# Patient Record
Sex: Male | Born: 1970 | Race: Black or African American | Hispanic: No | Marital: Married | Smoking: Former smoker
Health system: Southern US, Community
[De-identification: ages and names within clinical notes are randomized; demographics above are authoritative.]

## PROBLEM LIST (undated history)

## (undated) DIAGNOSIS — S8990XA Unspecified injury of unspecified lower leg, initial encounter: Secondary | ICD-10-CM

## (undated) DIAGNOSIS — H3320 Serous retinal detachment, unspecified eye: Secondary | ICD-10-CM

## (undated) DIAGNOSIS — J189 Pneumonia, unspecified organism: Secondary | ICD-10-CM

## (undated) DIAGNOSIS — H5440 Blindness, one eye, unspecified eye: Secondary | ICD-10-CM

## (undated) HISTORY — PX: EYE SURGERY: SHX253

---

## 2004-01-25 ENCOUNTER — Emergency Department (HOSPITAL_COMMUNITY): Admission: EM | Admit: 2004-01-25 | Discharge: 2004-01-25 | Payer: Self-pay | Admitting: Emergency Medicine

## 2005-01-08 ENCOUNTER — Emergency Department (HOSPITAL_COMMUNITY): Admission: EM | Admit: 2005-01-08 | Discharge: 2005-01-08 | Payer: Self-pay | Admitting: Emergency Medicine

## 2007-08-25 DIAGNOSIS — Z8669 Personal history of other diseases of the nervous system and sense organs: Secondary | ICD-10-CM

## 2007-08-30 ENCOUNTER — Emergency Department (HOSPITAL_COMMUNITY): Admission: EM | Admit: 2007-08-30 | Discharge: 2007-08-30 | Payer: Self-pay | Admitting: Emergency Medicine

## 2008-02-13 ENCOUNTER — Ambulatory Visit: Payer: Self-pay | Admitting: Nurse Practitioner

## 2008-02-13 DIAGNOSIS — F17200 Nicotine dependence, unspecified, uncomplicated: Secondary | ICD-10-CM | POA: Insufficient documentation

## 2008-02-13 DIAGNOSIS — I10 Essential (primary) hypertension: Secondary | ICD-10-CM | POA: Insufficient documentation

## 2008-02-13 DIAGNOSIS — E669 Obesity, unspecified: Secondary | ICD-10-CM | POA: Insufficient documentation

## 2008-02-13 DIAGNOSIS — K219 Gastro-esophageal reflux disease without esophagitis: Secondary | ICD-10-CM | POA: Insufficient documentation

## 2008-02-13 LAB — CONVERTED CEMR LAB
ALT: 25 units/L (ref 0–53)
AST: 34 units/L (ref 0–37)
Albumin: 4.5 g/dL (ref 3.5–5.2)
Alkaline Phosphatase: 52 units/L (ref 39–117)
BUN: 14 mg/dL (ref 6–23)
Basophils Absolute: 0 10*3/uL (ref 0.0–0.1)
Basophils Relative: 0 % (ref 0–1)
CO2: 26 meq/L (ref 19–32)
Calcium: 9.9 mg/dL (ref 8.4–10.5)
Chloride: 104 meq/L (ref 96–112)
Cholesterol: 208 mg/dL — ABNORMAL HIGH (ref 0–200)
Creatinine, Ser: 1.35 mg/dL (ref 0.40–1.50)
Eosinophils Absolute: 0.1 10*3/uL (ref 0.0–0.7)
Eosinophils Relative: 2 % (ref 0–5)
Glucose, Bld: 82 mg/dL (ref 70–99)
HCT: 46.8 % (ref 39.0–52.0)
HDL: 40 mg/dL (ref >39)
Hemoglobin: 14.5 g/dL (ref 13.0–17.0)
LDL Cholesterol: 131 mg/dL — ABNORMAL HIGH (ref 0–99)
Lymphocytes Relative: 36 % (ref 12–46)
Lymphs Abs: 1.5 10*3/uL (ref 0.7–4.0)
MCHC: 31 g/dL (ref 30.0–36.0)
MCV: 80.3 fL (ref 78.0–100.0)
Monocytes Absolute: 0.4 10*3/uL (ref 0.1–1.0)
Monocytes Relative: 9 % (ref 3–12)
Neutro Abs: 2.1 10*3/uL (ref 1.7–7.7)
Neutrophils Relative %: 52 % (ref 43–77)
Platelets: 156 10*3/uL (ref 150–400)
Potassium: 4.8 meq/L (ref 3.5–5.3)
RBC: 5.83 M/uL — ABNORMAL HIGH (ref 4.22–5.81)
RDW: 15.9 % — ABNORMAL HIGH (ref 11.5–15.5)
Sodium: 140 meq/L (ref 135–145)
TSH: 2.101 microintl units/mL (ref 0.350–4.50)
Total Bilirubin: 0.5 mg/dL (ref 0.3–1.2)
Total CHOL/HDL Ratio: 5.2
Total Protein: 7.4 g/dL (ref 6.0–8.3)
Triglycerides: 183 mg/dL — ABNORMAL HIGH (ref <150)
VLDL: 37 mg/dL (ref 0–40)
WBC: 4 10*3/uL (ref 4.0–10.5)

## 2008-02-16 ENCOUNTER — Encounter (INDEPENDENT_AMBULATORY_CARE_PROVIDER_SITE_OTHER): Payer: Self-pay | Admitting: Nurse Practitioner

## 2008-03-15 ENCOUNTER — Ambulatory Visit: Payer: Self-pay | Admitting: Nurse Practitioner

## 2008-03-15 LAB — CONVERTED CEMR LAB
Bilirubin Urine: NEGATIVE
Blood in Urine, dipstick: NEGATIVE
Creatinine, Urine: 245.8 mg/dL
Glucose, Urine, Semiquant: NEGATIVE
Ketones, urine, test strip: NEGATIVE
Microalb Creat Ratio: 3.6 mg/g (ref 0.0–30.0)
Microalb, Ur: 0.89 mg/dL (ref 0.00–1.89)
Nitrite: NEGATIVE
Protein, U semiquant: NEGATIVE
Specific Gravity, Urine: >=1.03
Urobilinogen, UA: 0.2
WBC Urine, dipstick: NEGATIVE
pH: 5.5

## 2008-04-13 ENCOUNTER — Telehealth (INDEPENDENT_AMBULATORY_CARE_PROVIDER_SITE_OTHER): Payer: Self-pay | Admitting: Nurse Practitioner

## 2008-04-19 ENCOUNTER — Ambulatory Visit: Payer: Self-pay | Admitting: Nurse Practitioner

## 2008-04-19 DIAGNOSIS — R05 Cough: Secondary | ICD-10-CM | POA: Insufficient documentation

## 2008-05-03 ENCOUNTER — Ambulatory Visit: Payer: Self-pay | Admitting: Nurse Practitioner

## 2008-05-06 DIAGNOSIS — E781 Pure hyperglyceridemia: Secondary | ICD-10-CM | POA: Insufficient documentation

## 2008-05-06 LAB — CONVERTED CEMR LAB
ALT: 15 units/L (ref 0–53)
AST: 16 units/L (ref 0–37)
Albumin: 4.4 g/dL (ref 3.5–5.2)
Alkaline Phosphatase: 49 units/L (ref 39–117)
Bilirubin, Direct: 0.1 mg/dL (ref 0.0–0.3)
Cholesterol: 173 mg/dL (ref 0–200)
HDL: 34 mg/dL — ABNORMAL LOW (ref >39)
Indirect Bilirubin: 0.2 mg/dL (ref 0.0–0.9)
LDL Cholesterol: 88 mg/dL (ref 0–99)
Total Bilirubin: 0.3 mg/dL (ref 0.3–1.2)
Total CHOL/HDL Ratio: 5.1
Total Protein: 7.2 g/dL (ref 6.0–8.3)
Triglycerides: 257 mg/dL — ABNORMAL HIGH (ref <150)
VLDL: 51 mg/dL — ABNORMAL HIGH (ref 0–40)

## 2008-05-14 ENCOUNTER — Telehealth (INDEPENDENT_AMBULATORY_CARE_PROVIDER_SITE_OTHER): Payer: Self-pay | Admitting: *Deleted

## 2008-05-17 ENCOUNTER — Ambulatory Visit: Payer: Self-pay | Admitting: Nurse Practitioner

## 2008-05-21 ENCOUNTER — Encounter (INDEPENDENT_AMBULATORY_CARE_PROVIDER_SITE_OTHER): Payer: Self-pay | Admitting: Nurse Practitioner

## 2008-09-13 ENCOUNTER — Ambulatory Visit: Payer: Self-pay | Admitting: Nurse Practitioner

## 2008-09-13 LAB — CONVERTED CEMR LAB
Cholesterol, target level: 200 mg/dL
HDL goal, serum: 40 mg/dL
LDL Goal: 130 mg/dL

## 2008-09-14 ENCOUNTER — Encounter (INDEPENDENT_AMBULATORY_CARE_PROVIDER_SITE_OTHER): Payer: Self-pay | Admitting: Nurse Practitioner

## 2008-09-14 LAB — CONVERTED CEMR LAB
ALT: 16 units/L (ref 0–53)
AST: 24 units/L (ref 0–37)
Albumin: 4.3 g/dL (ref 3.5–5.2)
Alkaline Phosphatase: 45 units/L (ref 39–117)
BUN: 23 mg/dL (ref 6–23)
CO2: 23 meq/L (ref 19–32)
Calcium: 9.8 mg/dL (ref 8.4–10.5)
Chloride: 100 meq/L (ref 96–112)
Cholesterol: 200 mg/dL (ref 0–200)
Creatinine, Ser: 1.55 mg/dL — ABNORMAL HIGH (ref 0.40–1.50)
Glucose, Bld: 97 mg/dL (ref 70–99)
HDL: 46 mg/dL (ref >39)
LDL Cholesterol: 130 mg/dL — ABNORMAL HIGH (ref 0–99)
Potassium: 4.3 meq/L (ref 3.5–5.3)
Sodium: 137 meq/L (ref 135–145)
Total Bilirubin: 0.4 mg/dL (ref 0.3–1.2)
Total CHOL/HDL Ratio: 4.3
Total Protein: 7.2 g/dL (ref 6.0–8.3)
Triglycerides: 122 mg/dL (ref <150)
VLDL: 24 mg/dL (ref 0–40)

## 2008-11-26 ENCOUNTER — Ambulatory Visit: Payer: Self-pay | Admitting: Nurse Practitioner

## 2009-06-03 ENCOUNTER — Encounter (INDEPENDENT_AMBULATORY_CARE_PROVIDER_SITE_OTHER): Payer: Self-pay | Admitting: Nurse Practitioner

## 2009-09-30 ENCOUNTER — Encounter (INDEPENDENT_AMBULATORY_CARE_PROVIDER_SITE_OTHER): Payer: Self-pay | Admitting: Nurse Practitioner

## 2009-09-30 ENCOUNTER — Telehealth (INDEPENDENT_AMBULATORY_CARE_PROVIDER_SITE_OTHER): Payer: Self-pay | Admitting: Nurse Practitioner

## 2010-03-15 ENCOUNTER — Emergency Department (HOSPITAL_COMMUNITY)
Admission: EM | Admit: 2010-03-15 | Discharge: 2010-03-15 | Payer: Self-pay | Source: Home / Self Care | Admitting: Emergency Medicine

## 2010-04-25 NOTE — Letter (Signed)
Summary: MAILED REQUESTED RECORDS TO [facility]  MAILED REQUESTED RECORDS TO [facility]   Imported By: Arta Bruce 06/03/2009 15:21:53  _____________________________________________________________________  External Attachment:    Type:   Image     Comment:   External Document

## 2010-04-25 NOTE — Progress Notes (Signed)
Summary: Needs to see provider before more refills  Phone Note Outgoing Call   Call placed by: Dutch Quint RN,  September 30, 2009 2:44 PM Summary of Call: Pt. needs to see provider before any more meds can be refilled.  Unable to leave message for pt. -- no answering device.  Letter sent.  Dutch Quint RN  September 30, 2009 2:45 PM

## 2010-04-25 NOTE — Letter (Signed)
Summary: Generic Letter  [facility]  [address] [place], [place] [address]   Phone: [phone]  Fax: [phone]       09/30/2009  JAZE RODINO [address] [place], [place]  [address]  Dear Mr. CICIO,  We have been unable to contact you by telephone.  Please call our office, at your earliest convenience, so that we may speak with you.   Sincerely,   Dutch Quint RN

## 2010-10-21 ENCOUNTER — Emergency Department (HOSPITAL_COMMUNITY)
Admission: EM | Admit: 2010-10-21 | Discharge: 2010-10-21 | Disposition: A | Discharge status: Home / Self Care | Payer: Medicare Other | Attending: Emergency Medicine | Admitting: Emergency Medicine

## 2010-10-21 ENCOUNTER — Emergency Department (HOSPITAL_COMMUNITY): Payer: Medicare Other

## 2010-10-21 DIAGNOSIS — R509 Fever, unspecified: Secondary | ICD-10-CM | POA: Insufficient documentation

## 2010-10-21 DIAGNOSIS — I10 Essential (primary) hypertension: Secondary | ICD-10-CM | POA: Insufficient documentation

## 2010-10-21 DIAGNOSIS — E860 Dehydration: Secondary | ICD-10-CM | POA: Insufficient documentation

## 2010-10-21 DIAGNOSIS — R42 Dizziness and giddiness: Secondary | ICD-10-CM | POA: Insufficient documentation

## 2010-10-21 DIAGNOSIS — H5440 Blindness, one eye, unspecified eye: Secondary | ICD-10-CM | POA: Insufficient documentation

## 2010-10-21 DIAGNOSIS — I4949 Other premature depolarization: Secondary | ICD-10-CM | POA: Insufficient documentation

## 2010-10-21 DIAGNOSIS — R0602 Shortness of breath: Secondary | ICD-10-CM | POA: Insufficient documentation

## 2010-10-21 LAB — DIFFERENTIAL
Basophils Absolute: 0 10*3/uL (ref 0.0–0.1)
Basophils Relative: 0 % (ref 0–1)
Eosinophils Absolute: 0 10*3/uL (ref 0.0–0.7)
Eosinophils Relative: 1 % (ref 0–5)
Lymphocytes Relative: 24 % (ref 12–46)
Lymphs Abs: 1 10*3/uL (ref 0.7–4.0)
Monocytes Absolute: 0.3 10*3/uL (ref 0.1–1.0)
Monocytes Relative: 8 % (ref 3–12)
Neutro Abs: 3 10*3/uL (ref 1.7–7.7)
Neutrophils Relative %: 67 % (ref 43–77)

## 2010-10-21 LAB — BASIC METABOLIC PANEL
BUN: 20 mg/dL (ref 6–23)
CO2: 25 mEq/L (ref 19–32)
Calcium: 9.8 mg/dL (ref 8.4–10.5)
Chloride: 101 mEq/L (ref 96–112)
Creatinine, Ser: 1.51 mg/dL — ABNORMAL HIGH (ref 0.50–1.35)
GFR calc Af Amer: >60 mL/min (ref >60)
GFR calc non Af Amer: 51 mL/min — ABNORMAL LOW (ref >60)
Glucose, Bld: 111 mg/dL — ABNORMAL HIGH (ref 70–99)
Potassium: 3.9 mEq/L (ref 3.5–5.1)
Sodium: 137 mEq/L (ref 135–145)

## 2010-10-21 LAB — TROPONIN I: Troponin I: <0.3 ng/mL (ref <0.30)

## 2010-10-21 LAB — CBC
HCT: 43.5 % (ref 39.0–52.0)
Hemoglobin: 14.7 g/dL (ref 13.0–17.0)
MCH: 25.3 pg — ABNORMAL LOW (ref 26.0–34.0)
MCHC: 33.8 g/dL (ref 30.0–36.0)
MCV: 74.9 fL — ABNORMAL LOW (ref 78.0–100.0)
Platelets: 145 10*3/uL — ABNORMAL LOW (ref 150–400)
RBC: 5.81 MIL/uL (ref 4.22–5.81)
RDW: 14.6 % (ref 11.5–15.5)
WBC: 4.3 10*3/uL (ref 4.0–10.5)

## 2010-10-21 LAB — CK TOTAL AND CKMB (NOT AT ARMC)
CK, MB: 4.7 ng/mL — ABNORMAL HIGH (ref 0.3–4.0)
Relative Index: 1.2 (ref 0.0–2.5)
Total CK: 382 U/L — ABNORMAL HIGH (ref 7–232)

## 2010-10-23 LAB — CK TOTAL AND CKMB (NOT AT ARMC)
CK, MB: 4.1 ng/mL — ABNORMAL HIGH (ref 0.3–4.0)
Relative Index: 1.3 (ref 0.0–2.5)
Total CK: 328 U/L — ABNORMAL HIGH (ref 7–232)

## 2010-10-23 LAB — TROPONIN I: Troponin I: <0.3 ng/mL (ref <0.30)

## 2011-05-01 ENCOUNTER — Ambulatory Visit (INDEPENDENT_AMBULATORY_CARE_PROVIDER_SITE_OTHER): Payer: Medicare Other | Admitting: Family Medicine

## 2011-05-01 VITALS — BP 142/100 | HR 66 | Temp 98.7°F | Resp 18 | Ht 74.5 in | Wt 268.0 lb

## 2011-05-01 DIAGNOSIS — K219 Gastro-esophageal reflux disease without esophagitis: Secondary | ICD-10-CM | POA: Diagnosis not present

## 2011-05-01 DIAGNOSIS — I10 Essential (primary) hypertension: Secondary | ICD-10-CM

## 2011-05-01 DIAGNOSIS — Z0000 Encounter for general adult medical examination without abnormal findings: Secondary | ICD-10-CM | POA: Diagnosis not present

## 2011-05-01 LAB — CBC WITH DIFFERENTIAL/PLATELET
Basophils Absolute: 0 10*3/uL (ref 0.0–0.1)
Basophils Relative: 1 % (ref 0–1)
Eosinophils Absolute: 0.1 10*3/uL (ref 0.0–0.7)
Eosinophils Relative: 3 % (ref 0–5)
HCT: 43.7 % (ref 39.0–52.0)
Hemoglobin: 14.1 g/dL (ref 13.0–17.0)
Lymphocytes Relative: 37 % (ref 12–46)
Lymphs Abs: 1.3 10*3/uL (ref 0.7–4.0)
MCH: 24.7 pg — ABNORMAL LOW (ref 26.0–34.0)
MCHC: 32.3 g/dL (ref 30.0–36.0)
MCV: 76.4 fL — ABNORMAL LOW (ref 78.0–100.0)
Monocytes Absolute: 0.3 10*3/uL (ref 0.1–1.0)
Monocytes Relative: 9 % (ref 3–12)
Neutro Abs: 1.7 10*3/uL (ref 1.7–7.7)
Neutrophils Relative %: 50 % (ref 43–77)
Platelets: 126 10*3/uL — ABNORMAL LOW (ref 150–400)
RBC: 5.72 MIL/uL (ref 4.22–5.81)
RDW: 14.3 % (ref 11.5–15.5)
WBC: 3.5 10*3/uL — ABNORMAL LOW (ref 4.0–10.5)

## 2011-05-01 LAB — COMPREHENSIVE METABOLIC PANEL
ALT: 10 U/L (ref 0–53)
AST: 29 U/L (ref 0–37)
Albumin: 4.7 g/dL (ref 3.5–5.2)
Alkaline Phosphatase: 60 U/L (ref 39–117)
BUN: 17 mg/dL (ref 6–23)
CO2: 27 mEq/L (ref 19–32)
Calcium: 9.5 mg/dL (ref 8.4–10.5)
Chloride: 104 mEq/L (ref 96–112)
Creat: 1.49 mg/dL — ABNORMAL HIGH (ref 0.50–1.35)
Glucose, Bld: 96 mg/dL (ref 70–99)
Potassium: 4.7 mEq/L (ref 3.5–5.3)
Sodium: 140 mEq/L (ref 135–145)
Total Bilirubin: 0.4 mg/dL (ref 0.3–1.2)
Total Protein: 7.4 g/dL (ref 6.0–8.3)

## 2011-05-01 LAB — LIPID PANEL
Cholesterol: 198 mg/dL (ref 0–200)
HDL: 39 mg/dL — ABNORMAL LOW (ref >39)
LDL Cholesterol: 133 mg/dL — ABNORMAL HIGH (ref 0–99)
Total CHOL/HDL Ratio: 5.1 Ratio
Triglycerides: 130 mg/dL (ref <150)
VLDL: 26 mg/dL (ref 0–40)

## 2011-05-01 LAB — TSH: TSH: 1.912 u[IU]/mL (ref 0.350–4.500)

## 2011-05-01 MED ORDER — ASPIRIN EC 81 MG PO TBEC: 81.0000 mg | DELAYED_RELEASE_TABLET | Freq: Every day | ORAL | Status: DC

## 2011-05-01 MED ORDER — LISINOPRIL-HYDROCHLOROTHIAZIDE 20-25 MG PO TABS: 1.0000 | ORAL_TABLET | Freq: Every day | ORAL | Status: DC

## 2011-05-01 MED ORDER — OMEPRAZOLE 40 MG PO CPDR: 40.0000 mg | DELAYED_RELEASE_CAPSULE | Freq: Every day | ORAL | Status: DC

## 2011-05-01 NOTE — Progress Notes (Signed)
  Subjective:    Patient ID: Luberta Mutter, male    DOB: 10/13/1970, 41 y.o.   MRN: [id number]  HPI 41 year old AA male presents for CPE  Hypertension- not taking medications daily.  Rx expired and using wife's RX on prn basis.                   Review of Systems  Constitutional: Negative.   Respiratory: Negative for shortness of breath.   Cardiovascular: Palpitations: occasional.  Neurological: Negative for dizziness.       Objective:   Physical Exam  Constitutional: He is oriented to person, place, and time. He appears well-developed and well-nourished.  HENT:  Head: Normocephalic and atraumatic.  Right Ear: External ear normal.  Left Ear: External ear normal.  Eyes: Conjunctivae and EOM are normal. Pupils are equal, round, and reactive to light.  Neck: Normal range of motion. Neck supple.  Cardiovascular: Normal rate, regular rhythm and normal heart sounds.   Pulmonary/Chest: Effort normal and breath sounds normal.  Abdominal: Soft. Bowel sounds are normal.  Genitourinary: Rectum normal, prostate normal and penis normal.  Musculoskeletal: Normal range of motion.  Neurological: He is alert and oriented to person, place, and time.  Skin: Skin is warm and dry.          Assessment & Plan:   1. Routine general medical examination at a health care facility    2. HTN (hypertension)  EKG 12-Lead, CBC with Differential, Comprehensive metabolic panel, Lipid panel, TSH  3. GERD (gastroesophageal reflux disease)     P/ Anticipatory guidance

## 2011-05-28 ENCOUNTER — Ambulatory Visit (INDEPENDENT_AMBULATORY_CARE_PROVIDER_SITE_OTHER): Payer: Medicare Other | Admitting: Family Medicine

## 2011-05-28 ENCOUNTER — Ambulatory Visit: Payer: Medicare Other

## 2011-05-28 VITALS — BP 125/81 | HR 92 | Temp 99.0°F | Resp 20 | Ht 73.5 in | Wt 260.0 lb

## 2011-05-28 DIAGNOSIS — R112 Nausea with vomiting, unspecified: Secondary | ICD-10-CM

## 2011-05-28 DIAGNOSIS — R1110 Vomiting, unspecified: Secondary | ICD-10-CM

## 2011-05-28 DIAGNOSIS — M79643 Pain in unspecified hand: Secondary | ICD-10-CM

## 2011-05-28 DIAGNOSIS — M25549 Pain in joints of unspecified hand: Secondary | ICD-10-CM | POA: Diagnosis not present

## 2011-05-28 DIAGNOSIS — R197 Diarrhea, unspecified: Secondary | ICD-10-CM

## 2011-05-28 LAB — POCT CBC
Granulocyte percent: 70.1 %G (ref 37–80)
HCT, POC: 45.6 % (ref 43.5–53.7)
Hemoglobin: 15 g/dL (ref 14.1–18.1)
Lymph, poc: 0.7 (ref 0.6–3.4)
MCH, POC: 25.1 pg — AB (ref 27–31.2)
MCHC: 32.9 g/dL (ref 31.8–35.4)
MCV: 76.4 fL — AB (ref 80–97)
MID (cbc): 0.4 (ref 0–0.9)
MPV: 11 fL (ref 0–99.8)
POC Granulocyte: 2.7 (ref 2–6.9)
POC LYMPH PERCENT: 19.2 %L (ref 10–50)
POC MID %: 10.7 %M (ref 0–12)
Platelet Count, POC: 161 10*3/uL (ref 142–424)
RBC: 5.97 M/uL (ref 4.69–6.13)
RDW, POC: 14.6 %
WBC: 3.9 10*3/uL — AB (ref 4.6–10.2)

## 2011-05-28 MED ORDER — ONDANSETRON HCL 8 MG PO TABS: 8.0000 mg | ORAL_TABLET | Freq: Three times a day (TID) | ORAL | Status: AC | PRN

## 2011-05-28 MED ORDER — ONDANSETRON 4 MG PO TBDP
8.0000 mg | ORAL_TABLET | Freq: Once | ORAL | Status: AC
  Administered 2011-05-28: 8 mg via ORAL

## 2011-05-28 NOTE — Progress Notes (Signed)
Patient Name: Richard Bautista Date of Birth: 02-Aug-1970 Medical Record Number: [id number] Gender: male Date of Encounter: 05/28/2011  History of Present Illness:  Richard Bautista is a 41 y.o. very pleasant male patient who presents with the following:  Here today with illness- vomiting, aches, diarrhea, fever, chills,feels lightheaded and weak. Also notes a rash on his face. Vomiting started yesterday evening, diarrhea started a little bit earlier.  No blood in diarrhea.  Did not check temperature and has been taking ibuprofent but none yet today.  Has thrown up about 3x, diarrhea 4 or 5 times.  Still feels nauseated.  Has been able to eat some, but has vomitied some of his intake.  Drank just a little juice this am.  Wife noted rash on his face and wonders what it is. No other rash  Also notes that he hurt his left 4th finger in martial arts last week- wonders if he should have an xray.  No swelling recently exposed to a friend with GI illness Patient Active Problem List  Diagnoses  . HYPERTRIGLYCERIDEMIA  . OBESITY  . TOBACCO ABUSE  . HYPERTENSION, BENIGN ESSENTIAL  . GERD  . COUGH  . RETINAL DETACHMENT, RIGHT EYE, HX OF   No past medical history on file. No past surgical history on file. History  Substance Use Topics  . Smoking status: Former Smoker    Types: Cigars    Quit date: 02/28/2011  . Smokeless tobacco: Not on file  . Alcohol Use: 0.0 oz/week   No family history on file. Allergies  Allergen Reactions  . Erythromycin     REACTION: faint, chills    Medication list has been reviewed and updated.  Review of Systems: As per HPI- otherwise negative.   Physical Examination: Filed Vitals:   05/28/11 0847  BP: 125/81  Pulse: 92  Temp: 99 F (37.2 C)  TempSrc: Oral  Resp: 20  Height: 6' 1.5" (1.867 m)  Weight: 260 lb (117.935 kg)    Body mass index is 33.84 kg/(m^2).  GEN: WDWN, NAD, Non-toxic, A & O x 3, obese HEENT: Atraumatic, Normocephalic. Neck  supple. No masses, No LAD. TM and oropharynx wnl- pupils are post- surgical bilaterally Ears and Nose: No external deformity. CV: RRR, No M/G/R. No JVD. No thrill. No extra heart sounds. PULM: CTA B, no wheezes, crackles, rhonchi. No retractions. No resp. distress. No accessory muscle use. ABD: S, NT, ND, +BS. No rebound. No HSM. Obese but no tenderness EXTR: No c/c/e.  Left ring finger has a small abrasion, but no swelling, tenderness or redness, full ROM NEURO Normal gait.  PSYCH: Normally interactive. Conversant. Not depressed or anxious appearing.  Calm demeanor.  Here is a slight "rash" on his face- around eyes.  This appears to be broken blood vessels due to vomiting, no other rash visible  [facility] reading (PRIMARY) by  Dr. Patsy Lager.  negative hand   Results for orders placed in visit on 05/28/11  POCT CBC      Component Value Range   WBC 3.9 (*) 4.6 - 10.2 (K/uL)   Lymph, poc 0.7  0.6 - 3.4    POC LYMPH PERCENT 19.2  10 - 50 (%L)   MID (cbc) 0.4  0 - 0.9    POC MID % 10.7  0 - 12 (%M)   POC Granulocyte 2.7  2 - 6.9    Granulocyte percent 70.1  37 - 80 (%G)   RBC 5.97  4.69 - 6.13 (M/uL)  Hemoglobin 15.0  14.1 - 18.1 (g/dL)   HCT, POC 13.0  86.5 - 53.7 (%)   MCV 76.4 (*) 80 - 97 (fL)   MCH, POC 25.1 (*) 27 - 31.2 (pg)   MCHC 32.9  31.8 - 35.4 (g/dL)   RDW, POC 78.4     Platelet Count, POC 161  142 - 424 (K/uL)   MPV 11.0  0 - 99.8 (fL)    Assessment and Plan: 1. Vomiting  ondansetron (ZOFRAN-ODT) disintegrating tablet 8 mg, ondansetron (ZOFRAN) 8 MG tablet  2. Diarrhea    3. Hand pain  DG Hand Complete Left   Felt better after PO zofran at clinic.  Gave zofran rx to take home and discussed pushing fluids, brat diet with patient and his wife.  He will let us know if he does not continue to improve, sooner if other symptoms develop. He will avoid martial arts until his hand feels back to normal.

## 2011-06-04 ENCOUNTER — Telehealth: Payer: Self-pay | Admitting: Family Medicine

## 2011-06-04 NOTE — Telephone Encounter (Signed)
Per Dr. Patsy Lager, call patient to check status.  Apparently he was unable to receive Zofran rx (fax in call bin).  Does he still need this?    LMOM to CB.

## 2011-06-07 ENCOUNTER — Ambulatory Visit (INDEPENDENT_AMBULATORY_CARE_PROVIDER_SITE_OTHER): Payer: Medicare Other | Admitting: Emergency Medicine

## 2011-06-07 VITALS — BP 132/80 | HR 73 | Temp 98.2°F | Resp 16 | Ht 73.5 in | Wt 265.0 lb

## 2011-06-07 DIAGNOSIS — R070 Pain in throat: Secondary | ICD-10-CM

## 2011-06-07 DIAGNOSIS — R509 Fever, unspecified: Secondary | ICD-10-CM

## 2011-06-07 DIAGNOSIS — R5382 Chronic fatigue, unspecified: Secondary | ICD-10-CM | POA: Diagnosis not present

## 2011-06-07 DIAGNOSIS — J029 Acute pharyngitis, unspecified: Secondary | ICD-10-CM

## 2011-06-07 LAB — POCT INFLUENZA A/B: Influenza B, POC: NEGATIVE

## 2011-06-07 LAB — POCT RAPID STREP A (OFFICE): Rapid Strep A Screen: NEGATIVE

## 2011-06-07 MED ORDER — MAGIC MOUTHWASH W/LIDOCAINE: ORAL | Status: DC

## 2011-06-07 MED ORDER — OMEPRAZOLE 40 MG PO CPDR: 40.0000 mg | DELAYED_RELEASE_CAPSULE | Freq: Every day | ORAL | Status: DC

## 2011-06-07 NOTE — Progress Notes (Signed)
  Subjective:    Patient ID: Richard Bautista, male    DOB: [birth year], 41 y.o.   MRN: [id number]  HPI patient here with onset 2 days ago of fever headache sore throat. He also feels dizzy. He feels that he gets lightheaded when he is up. He has had chest pain but only when he coughs. He at times has had production of a brown colored phlegm.    Review of Systems  Constitutional: Positive for fever, appetite change and fatigue.  HENT: Positive for sore throat. Negative for mouth sores and trouble swallowing.   Eyes:       Patient is blind in one eye.  Respiratory: Positive for cough.        His cough is productive at times of brownish type phlegm  Cardiovascular: Negative.   Gastrointestinal: Negative.   Genitourinary: Negative.   Musculoskeletal: Positive for myalgias.       Objective:   Physical Exam  Constitutional: He appears well-developed.  HENT:  Head: Normocephalic.  Right Ear: External ear normal.  Left Ear: External ear normal.  Neck: Neck supple. No JVD present. No tracheal deviation present. No thyromegaly present.       Throat is red.  Cardiovascular: Normal rate, regular rhythm and normal heart sounds.   Pulmonary/Chest: No stridor. No respiratory distress. He has no wheezes. He has no rales. He exhibits no tenderness.  Abdominal: There is no tenderness. There is no rebound.  Lymphadenopathy:    He has no cervical adenopathy.          Assessment & Plan:   Patient here with flulike symptoms. We'll check strep test and flu test.

## 2011-06-08 NOTE — Telephone Encounter (Signed)
Called [facility] and gave them permission to substitute their formula for Magic MW as long as it contains Lidocaine. Also called pt to notify him this was done and that we have just received form for PA of omeprazole and will fill it out and fax it back to insurance. Pt thanked [place]

## 2011-06-08 NOTE — Telephone Encounter (Signed)
Pt states that the pharmacy told him that we needed to contact his insurance company regarding the omeprazole, and also they state that we never sent over instructions for the mouthwash that he was prescribed. Pharmacy: [facility] [phone]

## 2011-06-15 ENCOUNTER — Other Ambulatory Visit: Payer: Self-pay | Admitting: *Deleted

## 2011-06-15 MED ORDER — OMEPRAZOLE 20 MG PO CPDR: 40.0000 mg | DELAYED_RELEASE_CAPSULE | Freq: Every day | ORAL | Status: DC

## 2011-07-31 DIAGNOSIS — H35379 Puckering of macula, unspecified eye: Secondary | ICD-10-CM | POA: Diagnosis not present

## 2011-08-27 DIAGNOSIS — S6000XA Contusion of unspecified finger without damage to nail, initial encounter: Secondary | ICD-10-CM | POA: Diagnosis not present

## 2011-09-11 ENCOUNTER — Emergency Department (HOSPITAL_COMMUNITY): Payer: Medicare Other

## 2011-09-11 ENCOUNTER — Encounter (HOSPITAL_COMMUNITY): Payer: Self-pay

## 2011-09-11 ENCOUNTER — Emergency Department (HOSPITAL_COMMUNITY)
Admission: EM | Admit: 2011-09-11 | Discharge: 2011-09-11 | Disposition: A | Discharge status: Home / Self Care | Payer: Medicare Other | Attending: Emergency Medicine | Admitting: Emergency Medicine

## 2011-09-11 DIAGNOSIS — IMO0002 Reserved for concepts with insufficient information to code with codable children: Secondary | ICD-10-CM

## 2011-09-11 DIAGNOSIS — Z87891 Personal history of nicotine dependence: Secondary | ICD-10-CM | POA: Insufficient documentation

## 2011-09-11 DIAGNOSIS — Z7982 Long term (current) use of aspirin: Secondary | ICD-10-CM | POA: Diagnosis not present

## 2011-09-11 DIAGNOSIS — S335XXA Sprain of ligaments of lumbar spine, initial encounter: Secondary | ICD-10-CM | POA: Diagnosis not present

## 2011-09-11 DIAGNOSIS — M545 Low back pain, unspecified: Secondary | ICD-10-CM | POA: Diagnosis not present

## 2011-09-11 DIAGNOSIS — Z79899 Other long term (current) drug therapy: Secondary | ICD-10-CM | POA: Diagnosis not present

## 2011-09-11 DIAGNOSIS — M47817 Spondylosis without myelopathy or radiculopathy, lumbosacral region: Secondary | ICD-10-CM | POA: Diagnosis not present

## 2011-09-11 HISTORY — DX: Blindness, one eye, unspecified eye: H54.40

## 2011-09-11 HISTORY — DX: Pneumonia, unspecified organism: J18.9

## 2011-09-11 HISTORY — DX: Unspecified injury of unspecified lower leg, initial encounter: S89.90XA

## 2011-09-11 HISTORY — DX: Serous retinal detachment, unspecified eye: H33.20

## 2011-09-11 LAB — URINALYSIS, ROUTINE W REFLEX MICROSCOPIC
Bilirubin Urine: NEGATIVE
Nitrite: NEGATIVE
Specific Gravity, Urine: 1.027 (ref 1.005–1.030)
pH: 6 (ref 5.0–8.0)

## 2011-09-11 MED ORDER — DIAZEPAM 5 MG PO TABS
10.0000 mg | ORAL_TABLET | Freq: Once | ORAL | Status: AC
  Administered 2011-09-11: 10 mg via ORAL
  Filled 2011-09-11: qty 1

## 2011-09-11 MED ORDER — METHOCARBAMOL 750 MG PO TABS: 750.0000 mg | ORAL_TABLET | Freq: Four times a day (QID) | ORAL | Status: AC

## 2011-09-11 MED ORDER — IBUPROFEN 600 MG PO TABS: 600.0000 mg | ORAL_TABLET | Freq: Four times a day (QID) | ORAL | Status: AC | PRN

## 2011-09-11 MED ORDER — OXYCODONE-ACETAMINOPHEN 5-325 MG PO TABS
2.0000 | ORAL_TABLET | Freq: Once | ORAL | Status: AC
  Administered 2011-09-11: 2 via ORAL
  Filled 2011-09-11: qty 2

## 2011-09-11 MED ORDER — OXYCODONE-ACETAMINOPHEN 5-325 MG PO TABS: 2.0000 | ORAL_TABLET | ORAL | Status: AC | PRN

## 2011-09-11 MED ORDER — DIAZEPAM 5 MG PO TABS
ORAL_TABLET | ORAL | Status: AC
  Filled 2011-09-11: qty 1

## 2011-09-11 NOTE — ED Notes (Signed)
Patient transported to X-ray 
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
## 2011-09-11 NOTE — Discharge Instructions (Signed)
Back Pain, Adult Low back pain is very common. About 1 in 5 people have back pain.The cause of low back pain is rarely dangerous. The pain often gets better over time.About half of people with a sudden onset of back pain feel better in just 2 weeks. About 8 in 10 people feel better by 6 weeks.  CAUSES Some common causes of back pain include:  Strain of the muscles or ligaments supporting the spine.   Wear and tear (degeneration) of the spinal discs.   Arthritis.   Direct injury to the back.  DIAGNOSIS Most of the time, the direct cause of low back pain is not known.However, back pain can be treated effectively even when the exact cause of the pain is unknown.Answering your caregiver's questions about your overall health and symptoms is one of the most accurate ways to make sure the cause of your pain is not dangerous. If your caregiver needs more information, he or she may order lab work or imaging tests (X-rays or MRIs).However, even if imaging tests show changes in your back, this usually does not require surgery. HOME CARE INSTRUCTIONS For many people, back pain returns.Since low back pain is rarely dangerous, it is often a condition that people can learn to manageon their own.   Remain active. It is stressful on the back to sit or stand in one place. Do not sit, drive, or stand in one place for more than 30 minutes at a time. Take short walks on level surfaces as soon as pain allows.Try to increase the length of time you walk each day.   Do not stay in bed.Resting more than 1 or 2 days can delay your recovery.   Do not avoid exercise or work.Your body is made to move.It is not dangerous to be active, even though your back may hurt.Your back will likely heal faster if you return to being active before your pain is gone.   Pay attention to your body when you bend and lift. Many people have less discomfortwhen lifting if they bend their knees, keep the load close to their  bodies,and avoid twisting. Often, the most comfortable positions are those that put less stress on your recovering back.   Find a comfortable position to sleep. Use a firm mattress and lie on your side with your knees slightly bent. If you lie on your back, put a pillow under your knees.   Only take over-the-counter or prescription medicines as directed by your caregiver. Over-the-counter medicines to reduce pain and inflammation are often the most helpful.Your caregiver may prescribe muscle relaxant drugs.These medicines help dull your pain so you can more quickly return to your normal activities and healthy exercise.   Put ice on the injured area.   Put ice in a plastic bag.   Place a towel between your skin and the bag.   Leave the ice on for 15 to 20 minutes, 3 to 4 times a day for the first 2 to 3 days. After that, ice and heat may be alternated to reduce pain and spasms.   Ask your caregiver about trying back exercises and gentle massage. This may be of some benefit.   Avoid feeling anxious or stressed.Stress increases muscle tension and can worsen back pain.It is important to recognize when you are anxious or stressed and learn ways to manage it.Exercise is a great option.  SEEK MEDICAL CARE IF:  You have pain that is not relieved with rest or medicine.   You have   pain that does not improve in 1 week.   You have new symptoms.   You are generally not feeling well.  SEEK IMMEDIATE MEDICAL CARE IF:   You have pain that radiates from your back into your legs.   You develop new bowel or bladder control problems.   You have unusual weakness or numbness in your arms or legs.   You develop nausea or vomiting.   You develop abdominal pain.   You feel faint.  Document Released: 03/12/2005 Document Revised: 03/01/2011 Document Reviewed: 07/31/2010 [employer] Patient Information 2012 [employer]. 
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
## 2011-10-11 DIAGNOSIS — H35349 Macular cyst, hole, or pseudohole, unspecified eye: Secondary | ICD-10-CM | POA: Diagnosis not present

## 2011-10-11 DIAGNOSIS — Z8669 Personal history of other diseases of the nervous system and sense organs: Secondary | ICD-10-CM | POA: Diagnosis not present

## 2011-10-11 DIAGNOSIS — H43819 Vitreous degeneration, unspecified eye: Secondary | ICD-10-CM | POA: Diagnosis not present

## 2011-10-11 DIAGNOSIS — H2703 Aphakia, bilateral: Secondary | ICD-10-CM | POA: Insufficient documentation

## 2011-10-11 DIAGNOSIS — H35342 Macular cyst, hole, or pseudohole, left eye: Secondary | ICD-10-CM | POA: Insufficient documentation

## 2011-10-11 DIAGNOSIS — H2700 Aphakia, unspecified eye: Secondary | ICD-10-CM | POA: Diagnosis not present

## 2011-10-11 DIAGNOSIS — H31019 Macula scars of posterior pole (postinflammatory) (post-traumatic), unspecified eye: Secondary | ICD-10-CM | POA: Diagnosis not present

## 2011-10-11 DIAGNOSIS — Z9889 Other specified postprocedural states: Secondary | ICD-10-CM | POA: Diagnosis not present

## 2011-11-07 ENCOUNTER — Other Ambulatory Visit: Payer: Self-pay | Admitting: Family Medicine

## 2012-01-17 ENCOUNTER — Other Ambulatory Visit: Payer: Self-pay | Admitting: Physician Assistant

## 2012-05-05 ENCOUNTER — Encounter (HOSPITAL_COMMUNITY): Payer: Self-pay | Admitting: *Deleted

## 2012-05-05 ENCOUNTER — Emergency Department (HOSPITAL_COMMUNITY)
Admission: EM | Admit: 2012-05-05 | Discharge: 2012-05-05 | Disposition: A | Discharge status: Home / Self Care | Payer: Medicare Other | Attending: Emergency Medicine | Admitting: Emergency Medicine

## 2012-05-05 ENCOUNTER — Emergency Department (HOSPITAL_COMMUNITY): Payer: Medicare Other

## 2012-05-05 DIAGNOSIS — Z79899 Other long term (current) drug therapy: Secondary | ICD-10-CM | POA: Diagnosis not present

## 2012-05-05 DIAGNOSIS — H5440 Blindness, one eye, unspecified eye: Secondary | ICD-10-CM | POA: Diagnosis not present

## 2012-05-05 DIAGNOSIS — Z87891 Personal history of nicotine dependence: Secondary | ICD-10-CM | POA: Diagnosis not present

## 2012-05-05 DIAGNOSIS — J40 Bronchitis, not specified as acute or chronic: Secondary | ICD-10-CM | POA: Diagnosis not present

## 2012-05-05 DIAGNOSIS — R1110 Vomiting, unspecified: Secondary | ICD-10-CM | POA: Insufficient documentation

## 2012-05-05 DIAGNOSIS — Z8739 Personal history of other diseases of the musculoskeletal system and connective tissue: Secondary | ICD-10-CM | POA: Insufficient documentation

## 2012-05-05 DIAGNOSIS — J3489 Other specified disorders of nose and nasal sinuses: Secondary | ICD-10-CM | POA: Insufficient documentation

## 2012-05-05 DIAGNOSIS — Z8701 Personal history of pneumonia (recurrent): Secondary | ICD-10-CM | POA: Diagnosis not present

## 2012-05-05 DIAGNOSIS — J209 Acute bronchitis, unspecified: Secondary | ICD-10-CM | POA: Diagnosis not present

## 2012-05-05 DIAGNOSIS — J42 Unspecified chronic bronchitis: Secondary | ICD-10-CM | POA: Diagnosis not present

## 2012-05-05 MED ORDER — HYDROCOD POLST-CHLORPHEN POLST 10-8 MG/5ML PO LQCR: 5.0000 mL | Freq: Two times a day (BID) | ORAL | Status: DC | PRN

## 2012-05-05 MED ORDER — ALBUTEROL SULFATE HFA 108 (90 BASE) MCG/ACT IN AERS
1.0000 | INHALATION_SPRAY | Freq: Four times a day (QID) | RESPIRATORY_TRACT | Status: DC | PRN
  Administered 2012-05-05: 2 via RESPIRATORY_TRACT
  Filled 2012-05-05: qty 6.7

## 2012-05-05 NOTE — ED Notes (Signed)
Pt states he has sore throat, head congestion, stomach hurting, and shortness of breath; started feeling bad about a week ago, worsened overnight;

## 2012-05-05 NOTE — ED Notes (Signed)
Pt stable at time of discharge; denied questions/concerns

## 2012-05-05 NOTE — Discharge Instructions (Signed)
 Bronchitis Bronchitis is the body's way of reacting to injury and/or infection (inflammation) of the bronchi. Bronchi are the air tubes that extend from the windpipe into the lungs. If the inflammation becomes severe, it may cause shortness of breath. CAUSES  Inflammation may be caused by:  A virus.  Germs (bacteria).  Dust.  Allergens.  Pollutants and many other irritants. The cells lining the bronchial tree are covered with tiny hairs (cilia). These constantly beat upward, away from the lungs, toward the mouth. This keeps the lungs free of pollutants. When these cells become too irritated and are unable to do their job, mucus begins to develop. This causes the characteristic cough of bronchitis. The cough clears the lungs when the cilia are unable to do their job. Without either of these protective mechanisms, the mucus would settle in the lungs. Then you would develop pneumonia. Smoking is a common cause of bronchitis and can contribute to pneumonia. Stopping this habit is the single most important thing you can do to help yourself. TREATMENT   Your caregiver may prescribe an antibiotic if the cough is caused by bacteria. Also, medicines that open up your airways make it easier to breathe. Your caregiver may also recommend or prescribe an expectorant. It will loosen the mucus to be coughed up. Only take over-the-counter or prescription medicines for pain, discomfort, or fever as directed by your caregiver.  Removing whatever causes the problem (smoking, for example) is critical to preventing the problem from getting worse.  Cough suppressants may be prescribed for relief of cough symptoms.  Inhaled medicines may be prescribed to help with symptoms now and to help prevent problems from returning.  For those with recurrent (chronic) bronchitis, there may be a need for steroid medicines. SEEK IMMEDIATE MEDICAL CARE IF:   During treatment, you develop more pus-like mucus (purulent  sputum).  You have a fever.  Your baby is older than 3 months with a rectal temperature of 102 F (38.9 C) or higher.  Your baby is 68 months old or younger with a rectal temperature of 100.4 F (38 C) or higher.  You become progressively more ill.  You have increased difficulty breathing, wheezing, or shortness of breath. It is necessary to seek immediate medical care if you are elderly or sick from any other disease. MAKE SURE YOU:   Understand these instructions.  Will watch your condition.  Will get help right away if you are not doing well or get worse. Document Released: 03/12/2005 Document Revised: 06/04/2011 Document Reviewed: 01/20/2008 [facility] Patient Information 2013 [place], [place].    Narcotic and benzodiazepine use may cause drowsiness, slowed breathing or dependence.  Please use with caution and do not drive, operate machinery or watch young children alone while taking them.  Taking combinations of these medications or drinking alcohol will potentiate these effects.

## 2012-05-05 NOTE — ED Provider Notes (Signed)
History     CSN: [id number]  Arrival date & time 05/05/12  0720   First MD Initiated Contact with Patient 05/05/12 [phone]      Chief Complaint  Patient presents with  . Sore Throat  . Nasal Congestion    (Consider location/radiation/quality/duration/timing/severity/associated sxs/prior treatment) HPI Comments: 42 y/o male presents to the ED complaining of  Non-productive cough, congestion, sore throat, chest pain, and subjective fever x1 day.  Patient complains of 3 episodes of vomiting after coughing spells.  He denies nausea.  Patient states that he feels that his symptoms have been stable, but fears he has pneumonia as he has had it in the past.  Patient felt some relief with 600 mg of ibuprofen.  No aggravating factors have been identified.  Patient denies getting a flu shot this year.  Patient smokes 2-3 black and mild cigars per week.    Patient is a 42 y.o. male presenting with pharyngitis. The history is provided by the patient. No language interpreter was used.  Sore Throat Associated symptoms include chest pain and shortness of breath.    Past Medical History  Diagnosis Date  . Blind one eye     right   . Detached retina   . Pneumonia   . Knee injury     Past Surgical History  Procedure Laterality Date  . Eye surgery      History reviewed. No pertinent family history.  History  Substance Use Topics  . Smoking status: Former Smoker    Types: Cigars    Quit date: 02/28/2011  . Smokeless tobacco: Never Used  . Alcohol Use: 0.0 oz/week     Comment: socially      Review of Systems  Constitutional: Positive for fever. Negative for chills.  HENT: Positive for congestion, sore throat, rhinorrhea and postnasal drip. Negative for ear pain, neck pain and neck stiffness.   Respiratory: Positive for cough and shortness of breath.   Cardiovascular: Positive for chest pain.  Gastrointestinal: Positive for vomiting. Negative for nausea, diarrhea and constipation.   Musculoskeletal: Positive for myalgias.  All other systems reviewed and are negative.    Allergies  Erythromycin  Home Medications   Current Outpatient Rx  Name  Route  Sig  Dispense  Refill  . guaiFENesin (MUCINEX) 600 MG 12 hr tablet   Oral   Take 600 mg by mouth 2 (two) times daily as needed for congestion.         Marland Kitchen ibuprofen (ADVIL,MOTRIN) 600 MG tablet   Oral   Take 600 mg by mouth every 8 (eight) hours as needed for pain.         Marland Kitchen lisinopril-hydrochlorothiazide (PRINZIDE,ZESTORETIC) 20-25 MG per tablet   Oral   Take 1 tablet by mouth daily.         Marland Kitchen omeprazole (PRILOSEC) 20 MG capsule   Oral   Take 2 capsules (40 mg total) by mouth daily.   180 capsule   3   . chlorpheniramine-HYDROcodone (TUSSIONEX PENNKINETIC ER) 10-8 MG/5ML LQCR   Oral   Take 5 mLs by mouth every 12 (twelve) hours as needed.   80 mL   0     BP 135/91  Pulse 75  Temp(Src) 98.1 F (36.7 C) (Oral)  SpO2 98%  Physical Exam  Nursing note and vitals reviewed. Constitutional: He is oriented to person, place, and time. He appears well-developed and well-nourished. No distress.  HENT:  Head: Normocephalic and atraumatic.  Right Ear: External ear normal.  Left Ear: External ear normal.  Nose: Mucosal edema present.  Mouth/Throat: Oropharynx is clear and moist. No oropharyngeal exudate.  Eyes: Conjunctivae are normal.  Neck: Normal range of motion. No tracheal deviation present.  Mild submental adenopathy  Cardiovascular: Normal rate, regular rhythm and normal heart sounds.  Exam reveals no gallop and no friction rub.   No murmur heard. Pulmonary/Chest: Effort normal. No respiratory distress. He has wheezes. He has no rales. He exhibits no tenderness.  Abdominal: Soft. Bowel sounds are normal. He exhibits no distension and no mass. There is no tenderness. There is no rebound and no guarding.  Musculoskeletal: Normal range of motion.  Neurological: He is alert and oriented to  person, place, and time.  Skin: Skin is warm and dry. No rash noted. He is not diaphoretic.  Psychiatric: He has a normal mood and affect. His behavior is normal.    ED Course  Procedures (including critical care time)  Labs Reviewed - No data to display Dg Chest 2 View  05/05/2012  *RADIOLOGY REPORT*  Clinical Data: Cough, weakness, chest pain for 3 days, hypertension, smoker  CHEST - 2 VIEW  Comparison: 10/21/2010  Findings: Normal heart size, mediastinal contours, and pulmonary vascularity. Mild chronic peribronchial thickening. No infiltrate, pleural effusion or pneumothorax. Question spina bifida occulta at C7.  IMPRESSION: Mild chronic bronchitic changes.   Original Report Authenticated By: Ulyses Southward, M.D.      1. Bronchitis     ra sat is 98% and I interpret to be normal  MDM  Pt with congestion, cough, nausea.  Pt smokes occasional cigar only.  Minimal wheeze on exam, looks well.  Pt with prior h/o pneumonia.  CXR obtained and is clear.  No fever.  PT told to take OTC URI medications, tussionex also given.        Gavin Pound. Oletta Lamas, MD 05/05/12 [phone]

## 2012-05-14 ENCOUNTER — Encounter (HOSPITAL_COMMUNITY): Payer: Self-pay | Admitting: Emergency Medicine

## 2012-05-14 ENCOUNTER — Emergency Department (HOSPITAL_COMMUNITY)
Admission: EM | Admit: 2012-05-14 | Discharge: 2012-05-14 | Disposition: A | Discharge status: Home / Self Care | Payer: Medicare Other | Attending: Emergency Medicine | Admitting: Emergency Medicine

## 2012-05-14 ENCOUNTER — Emergency Department (HOSPITAL_COMMUNITY): Payer: Medicare Other

## 2012-05-14 DIAGNOSIS — Z79899 Other long term (current) drug therapy: Secondary | ICD-10-CM | POA: Diagnosis not present

## 2012-05-14 DIAGNOSIS — Z8669 Personal history of other diseases of the nervous system and sense organs: Secondary | ICD-10-CM | POA: Insufficient documentation

## 2012-05-14 DIAGNOSIS — K219 Gastro-esophageal reflux disease without esophagitis: Secondary | ICD-10-CM | POA: Diagnosis not present

## 2012-05-14 DIAGNOSIS — S20229A Contusion of unspecified back wall of thorax, initial encounter: Secondary | ICD-10-CM | POA: Diagnosis not present

## 2012-05-14 DIAGNOSIS — Z8701 Personal history of pneumonia (recurrent): Secondary | ICD-10-CM | POA: Insufficient documentation

## 2012-05-14 DIAGNOSIS — I10 Essential (primary) hypertension: Secondary | ICD-10-CM | POA: Diagnosis not present

## 2012-05-14 DIAGNOSIS — W19XXXA Unspecified fall, initial encounter: Secondary | ICD-10-CM | POA: Insufficient documentation

## 2012-05-14 DIAGNOSIS — S239XXA Sprain of unspecified parts of thorax, initial encounter: Secondary | ICD-10-CM | POA: Insufficient documentation

## 2012-05-14 DIAGNOSIS — Y939 Activity, unspecified: Secondary | ICD-10-CM | POA: Insufficient documentation

## 2012-05-14 DIAGNOSIS — Y929 Unspecified place or not applicable: Secondary | ICD-10-CM | POA: Insufficient documentation

## 2012-05-14 DIAGNOSIS — Z87891 Personal history of nicotine dependence: Secondary | ICD-10-CM | POA: Insufficient documentation

## 2012-05-14 DIAGNOSIS — IMO0002 Reserved for concepts with insufficient information to code with codable children: Secondary | ICD-10-CM

## 2012-05-14 DIAGNOSIS — S20221A Contusion of right back wall of thorax, initial encounter: Secondary | ICD-10-CM

## 2012-05-14 MED ORDER — IBUPROFEN 800 MG PO TABS: 800.0000 mg | ORAL_TABLET | Freq: Three times a day (TID) | ORAL | Status: DC | PRN

## 2012-05-14 MED ORDER — HYDROCODONE-ACETAMINOPHEN 5-325 MG PO TABS: 1.0000 | ORAL_TABLET | Freq: Four times a day (QID) | ORAL | Status: DC | PRN

## 2012-05-14 NOTE — ED Provider Notes (Signed)
Medical screening examination/treatment/procedure(s) were performed by non-physician practitioner and as supervising physician I was immediately available for consultation/collaboration.  Donnetta Hutching, MD 05/14/12 1524

## 2012-05-14 NOTE — ED Notes (Signed)
Pt c/o of back pain due to a fall. 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

## 2012-05-14 NOTE — ED Provider Notes (Signed)
History     CSN: [id number]  Arrival date & time 05/14/12  4098   First MD Initiated Contact with Patient 05/14/12 [phone]      Chief Complaint  Patient presents with  . Fall  . Back Pain    (Consider location/radiation/quality/duration/timing/severity/associated sxs/prior treatment) HPI Patient is a 42 year old male with a PMH of hypertension and GERD who presents today with back and chest pain.  He was doing martial arts on Monday when he was doing a move on his partner and then ended up falling on his back.  He landed on his upper back and just didn't feel right after it.  Denies any loss of consciousness.   He took ibuprofen and the muscle relaxer robaxin which gave minimal relief.  Last night he started to notice right chest pain of of one of his ribs.  It's sharp that comes and goes, is a 4/10 of pain and notices the pain more when he's laying down in bed.  Denies shortness of breath, smoking history or previous MI.      Past Medical History  Diagnosis Date  . Blind one eye     right   . Detached retina   . Pneumonia   . Knee injury     Past Surgical History  Procedure Laterality Date  . Eye surgery      No family history on file.  History  Substance Use Topics  . Smoking status: Former Smoker    Types: Cigars    Quit date: 02/28/2011  . Smokeless tobacco: Never Used  . Alcohol Use: 0.0 oz/week     Comment: socially      Review of Systems All other systems negative except as documented in the HPI. All pertinent positives and negatives as reviewed in the HPI.  Allergies  Erythromycin  Home Medications   Current Outpatient Rx  Name  Route  Sig  Dispense  Refill  . chlorpheniramine-HYDROcodone (TUSSIONEX PENNKINETIC ER) 10-8 MG/5ML LQCR   Oral   Take 5 mLs by mouth every 12 (twelve) hours as needed.   80 mL   0   . guaiFENesin (MUCINEX) 600 MG 12 hr tablet   Oral   Take 600 mg by mouth 2 (two) times daily as needed for congestion.         Marland Kitchen  ibuprofen (ADVIL,MOTRIN) 600 MG tablet   Oral   Take 600 mg by mouth every 8 (eight) hours as needed for pain.         Marland Kitchen lisinopril-hydrochlorothiazide (PRINZIDE,ZESTORETIC) 20-25 MG per tablet   Oral   Take 1 tablet by mouth daily.         Marland Kitchen omeprazole (PRILOSEC) 20 MG capsule   Oral   Take 2 capsules (40 mg total) by mouth daily.   180 capsule   3     BP 150/98  Pulse 95  Temp(Src) 98.5 F (36.9 C) (Oral)  Resp 18  SpO2 98%  Physical Exam  Constitutional: He is oriented to person, place, and time. He appears well-developed and well-nourished. No distress.  HENT:  Head: Normocephalic and atraumatic.  Neck: Normal range of motion.  Cardiovascular: Normal rate, regular rhythm and normal heart sounds.  Exam reveals no gallop and no friction rub.   No murmur heard. Pulmonary/Chest: Effort normal and breath sounds normal. No respiratory distress. He has no wheezes. He has no rales. He exhibits no tenderness.  Abdominal: Soft. There is no tenderness.  Musculoskeletal: Normal range of motion.  Thoracic back: He exhibits tenderness. He exhibits no bony tenderness and no swelling.       Back:  No spinal tenderness  Neurological: He is alert and oriented to person, place, and time.  Skin: No rash noted. He is not diaphoretic.  Psychiatric: He has a normal mood and affect. His behavior is normal. Judgment and thought content normal.    ED Course  Procedures (including critical care time) The patient most likely has thoracic strain and contusion. The patient denies shortness of breath. The patient is advised to return here as needed for any worsening in his condition.    MDM  MDM Reviewed: nursing note and vitals Interpretation: x-ray            Carlyle Dolly, PA-C 05/14/12 [phone]

## 2012-06-19 IMAGING — CR DG LUMBAR SPINE COMPLETE 4+V
5 series · 5 of 5 positions shown · non-contrast
Comparison: None.

CLINICAL DATA: Low back pain

LUMBAR SPINE - COMPLETE 4+ VIEW

[t lumbar spine ap]
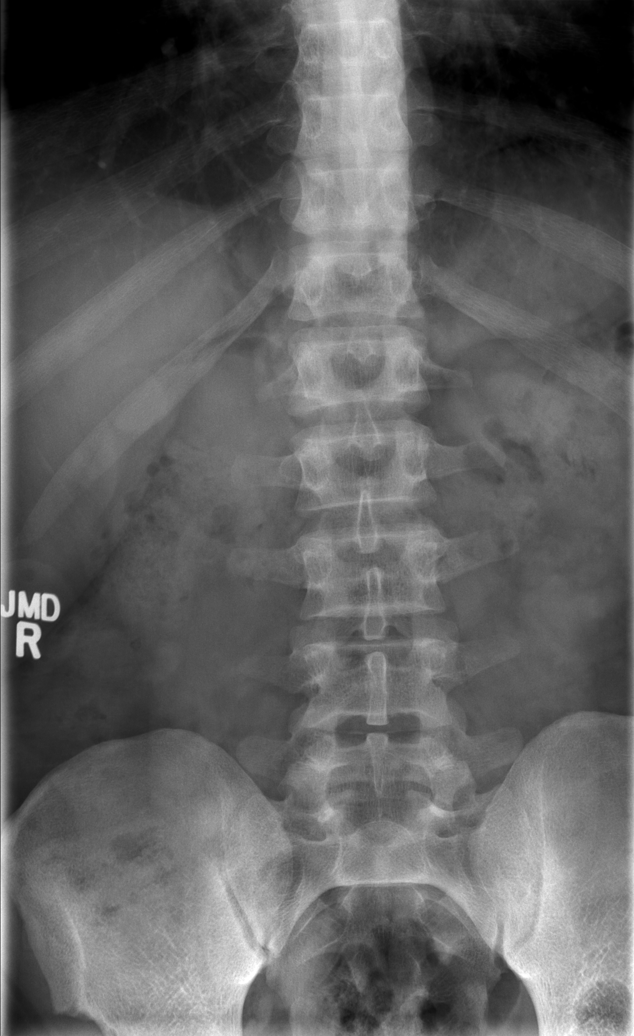

[t lumbar spine obl (1 of 2)]
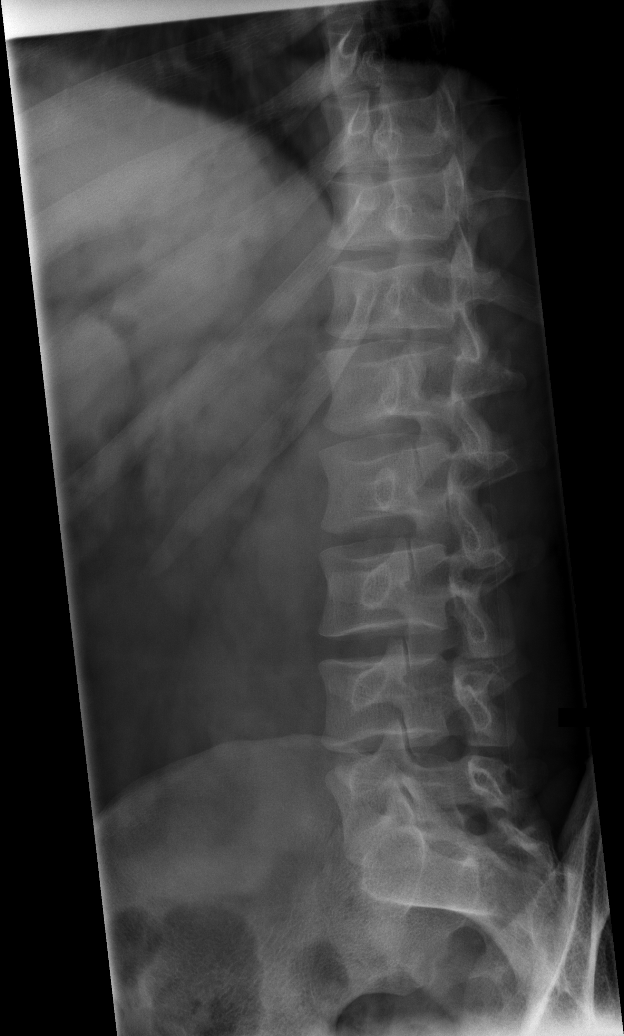

[t lumbar spine obl (2 of 2)]
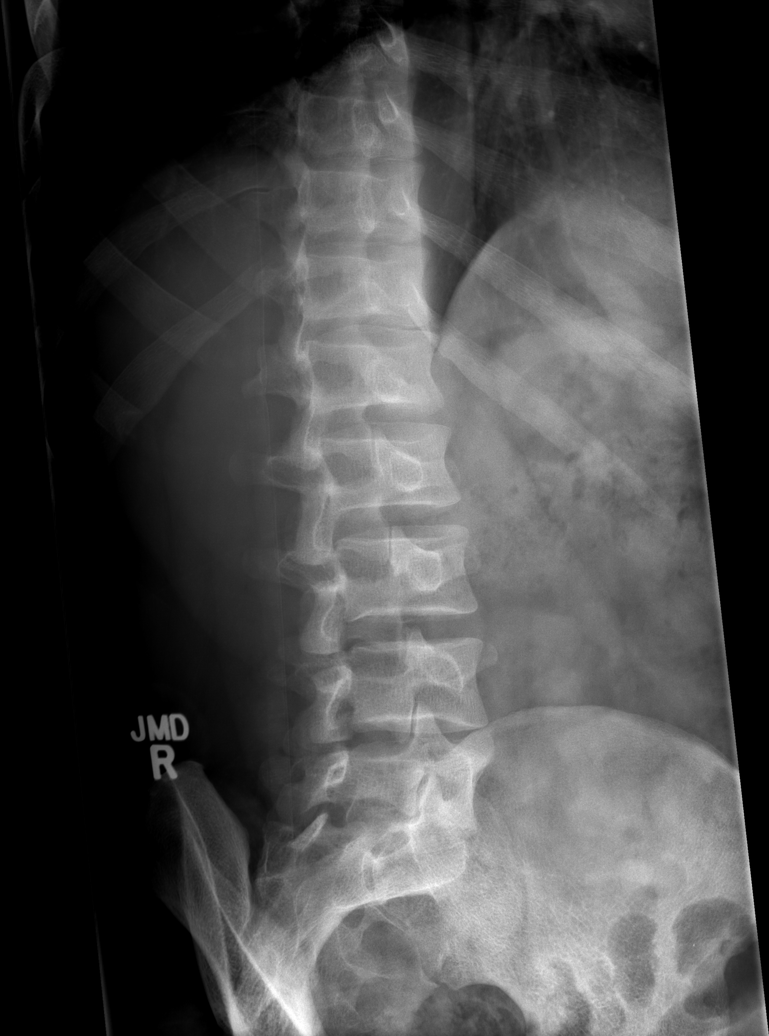

[t lumbar spine lat]
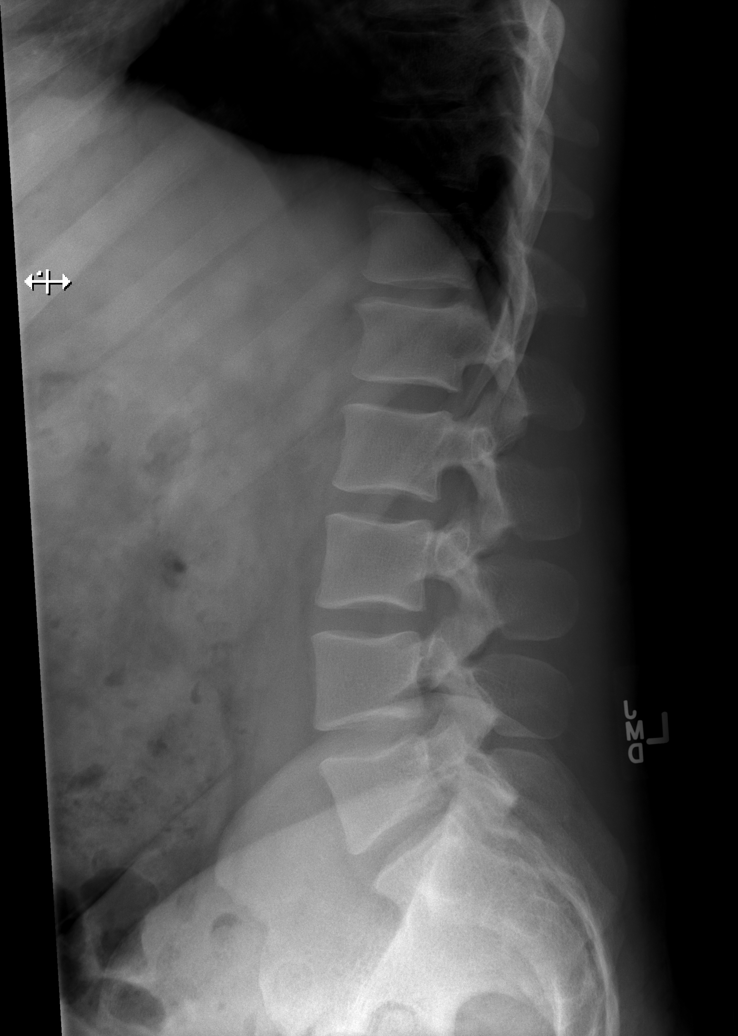

[t lumbar l-5 s-1 spot]
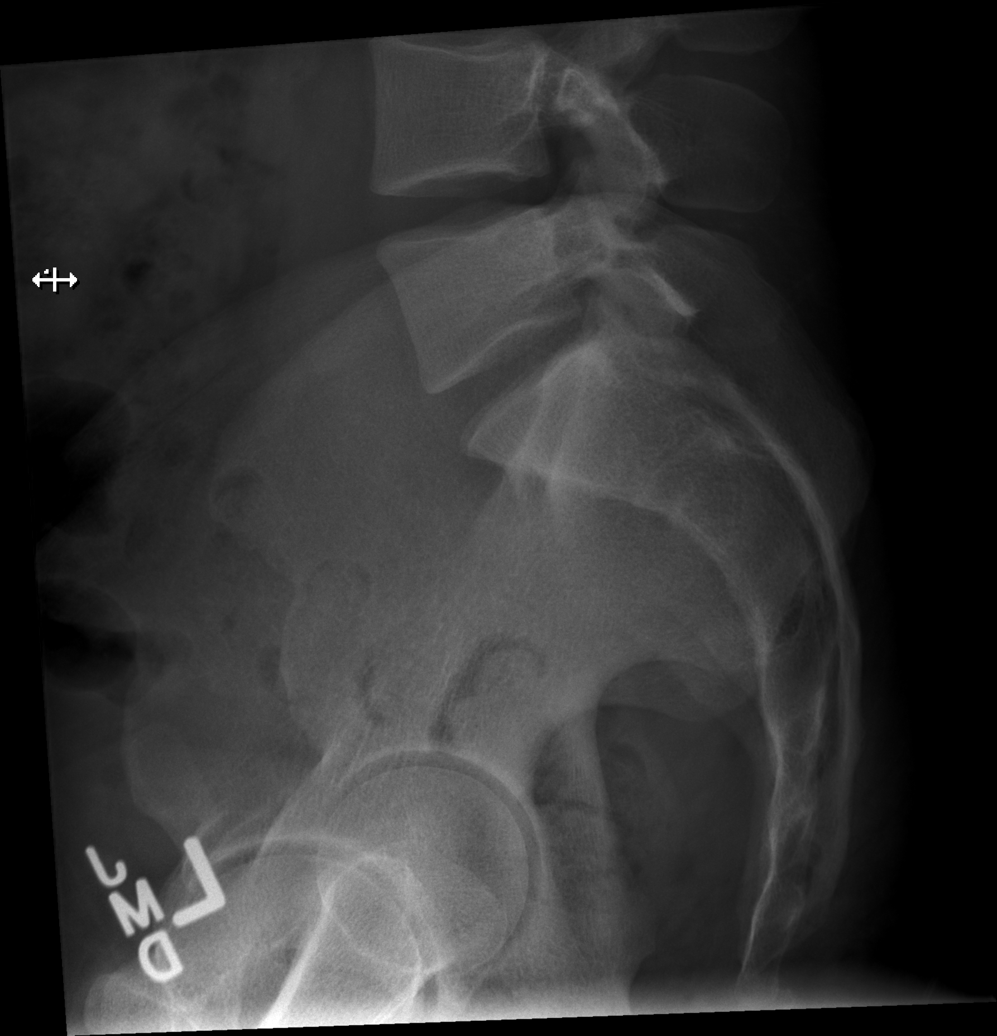

[5 of 5 positions shown; findings below may reference images not displayed]

FINDINGS: Minimal degenerative changes/anterior osteophyte
formation at T12-L1.  Otherwise, the imaged vertebral bodies and
inter-vertebral disc spaces are maintained. No displaced acute
fracture or dislocation identified.   The para-vertebral and
overlying soft tissues are within normal limits.  SI joints intact.
IMPRESSION: No acute osseous abnormality of the lumbar spine.

## 2012-07-27 ENCOUNTER — Emergency Department (HOSPITAL_COMMUNITY)
Admission: EM | Admit: 2012-07-27 | Discharge: 2012-07-27 | Disposition: A | Discharge status: Home / Self Care | Payer: Medicare Other | Attending: Emergency Medicine | Admitting: Emergency Medicine

## 2012-07-27 ENCOUNTER — Emergency Department (HOSPITAL_COMMUNITY): Payer: Medicare Other

## 2012-07-27 ENCOUNTER — Encounter (HOSPITAL_COMMUNITY): Payer: Self-pay | Admitting: Emergency Medicine

## 2012-07-27 DIAGNOSIS — Z87828 Personal history of other (healed) physical injury and trauma: Secondary | ICD-10-CM | POA: Diagnosis not present

## 2012-07-27 DIAGNOSIS — Z8701 Personal history of pneumonia (recurrent): Secondary | ICD-10-CM | POA: Diagnosis not present

## 2012-07-27 DIAGNOSIS — R079 Chest pain, unspecified: Secondary | ICD-10-CM | POA: Diagnosis not present

## 2012-07-27 DIAGNOSIS — X58XXXA Exposure to other specified factors, initial encounter: Secondary | ICD-10-CM | POA: Insufficient documentation

## 2012-07-27 DIAGNOSIS — Y9375 Activity, martial arts: Secondary | ICD-10-CM | POA: Insufficient documentation

## 2012-07-27 DIAGNOSIS — Z87891 Personal history of nicotine dependence: Secondary | ICD-10-CM | POA: Insufficient documentation

## 2012-07-27 DIAGNOSIS — S20219A Contusion of unspecified front wall of thorax, initial encounter: Secondary | ICD-10-CM | POA: Diagnosis not present

## 2012-07-27 DIAGNOSIS — Z79899 Other long term (current) drug therapy: Secondary | ICD-10-CM | POA: Diagnosis not present

## 2012-07-27 DIAGNOSIS — S20212A Contusion of left front wall of thorax, initial encounter: Secondary | ICD-10-CM

## 2012-07-27 DIAGNOSIS — Y92838 Other recreation area as the place of occurrence of the external cause: Secondary | ICD-10-CM | POA: Insufficient documentation

## 2012-07-27 DIAGNOSIS — Y9239 Other specified sports and athletic area as the place of occurrence of the external cause: Secondary | ICD-10-CM | POA: Insufficient documentation

## 2012-07-27 NOTE — ED Notes (Signed)
Pt states he was taking martial arts class two weeks ago and he was kneed in the left side. The pain disappeared, but he woke up the other morning and the pain had returned and has gotten progressively worse. Denies increased pain with a deep breath but pain increases with coughing and sneezing.

## 2012-07-27 NOTE — ED Provider Notes (Signed)
History     CSN: [id number]  Arrival date & time 07/27/12  0701   First MD Initiated Contact with Patient 07/27/12 [phone]      Chief Complaint  Patient presents with  . Flank Pain    (Consider location/radiation/quality/duration/timing/severity/associated sxs/prior treatment) HPI Comments: Richard Bautista is a 42 y.o. Male who has had left chest wall pain that is worsening for 2 weeks. 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 The pain is sharp and worsening now. It is aggravated by twisting, deep breathing and coughing. He denies inducing sputum. No fever or chills, nausea, vomiting, weakness, or dizziness. No, or back pain he tried ibuprofen without relief. There are no other modifying factors.  Patient is a 42 y.o. male presenting with flank pain. The history is provided by the patient.  Flank Pain    Past Medical History  Diagnosis Date  . Blind one eye     right   . Detached retina   . Pneumonia   . Knee injury     Past Surgical History  Procedure Laterality Date  . Eye surgery      No family history on file.  History  Substance Use Topics  . Smoking status: Former Smoker    Types: Cigars    Quit date: 02/28/2011  . Smokeless tobacco: Never Used  . Alcohol Use: 0.0 oz/week     Comment: socially      Review of Systems  Genitourinary: Positive for flank pain.  All other systems reviewed and are negative.    Allergies  Erythromycin  Home Medications   Current Outpatient Rx  Name  Route  Sig  Dispense  Refill  . Chlorphen-Pseudoephed-APAP (ALLERGY/SINUS APAP PO)   Oral   Take 1 tablet by mouth 2 (two) times daily as needed (cough congestion).         Marland Kitchen ibuprofen (ADVIL,MOTRIN) 600 MG tablet   Oral   Take 600 mg by mouth every 8 (eight) hours as needed for pain.         Marland Kitchen lisinopril-hydrochlorothiazide (PRINZIDE,ZESTORETIC) 20-25 MG per tablet   Oral   Take 1 tablet by mouth daily.         Marland Kitchen omeprazole (PRILOSEC) 20 MG  capsule   Oral   Take 40 mg by mouth daily.         . methocarbamol (ROBAXIN) 500 MG tablet   Oral   Take 500 mg by mouth 2 (two) times daily as needed. FOR MUSCLE SPASMS           BP 151/85  Pulse 66  Temp(Src) 98.6 F (37 C) (Oral)  Resp 15  Ht 6\' 3"  (1.905 m)  Wt 260 lb (117.935 kg)  BMI 32.5 kg/m2  SpO2 100%  Physical Exam  Nursing note and vitals reviewed. Constitutional: He is oriented to person, place, and time. He appears well-developed and well-nourished.  HENT:  Head: Normocephalic and atraumatic.  Right Ear: External ear normal.  Left Ear: External ear normal.  Eyes: Conjunctivae and EOM are normal. Pupils are equal, round, and reactive to light.  Neck: Normal range of motion and phonation normal. Neck supple.  Cardiovascular: Normal rate, regular rhythm, normal heart sounds and intact distal pulses.   Pulmonary/Chest: Effort normal and breath sounds normal. He exhibits tenderness (Mild left chest wall tenderness on ribs 9 and 10-anterolateral; no associated crepitation or deformity.). He exhibits no bony tenderness.  Abdominal: Soft. Normal appearance. There is no tenderness. There is no  rebound and no guarding.  Musculoskeletal: Normal range of motion.  Neurological: He is alert and oriented to person, place, and time. He has normal strength. No cranial nerve deficit or sensory deficit. He exhibits normal muscle tone. Coordination normal.  Skin: Skin is warm, dry and intact.  Psychiatric: He has a normal mood and affect. His behavior is normal. Judgment and thought content normal.    ED Course  Procedures (including critical care time)  Labs Reviewed - No data to display Dg Ribs Unilateral W/chest Left  07/27/2012  *RADIOLOGY REPORT*  Clinical Data: Flank pain.  Left lateral rib pain.  LEFT RIBS AND CHEST - 3+ VIEW  Comparison: Chest radiograph 05/14/2012  Findings: Chest radiograph again demonstrates a small retrocardiac density which is suggestive for a  small hiatal hernia.  The lungs are clear.  Negative for a pneumothorax.  Heart size is normal. Trachea is midline.  No evidence for a left rib fracture.  IMPRESSION: No acute chest findings.  No evidence for a left rib fracture.  Probable hiatal hernia.   Original Report Authenticated By: Richarda Overlie, M.D.      1. Contusion, chest wall, left, initial encounter       MDM  Subacute contusion to chest wall without apparent fracture. Doubt intrathoracic injury, abdominal injury or significant rib fracture. Doubt metabolic instability, serious bacterial infection or impending vascular collapse; the patient is stable for discharge.  Nursing Notes Reviewed/ Care Coordinated, and agree without changes. Applicable Imaging Reviewed.  Interpretation of Laboratory Data incorporated into ED treatment   Plan: Home Medications- ibuprofen; Home Treatments-  heat treatment; Recommended follow up- PCP, prn         Flint Melter, MD 07/27/12 1100

## 2012-09-30 ENCOUNTER — Emergency Department (HOSPITAL_COMMUNITY): Payer: Medicare Other

## 2012-09-30 ENCOUNTER — Encounter (HOSPITAL_COMMUNITY): Payer: Self-pay | Admitting: Emergency Medicine

## 2012-09-30 ENCOUNTER — Emergency Department (HOSPITAL_COMMUNITY)
Admission: EM | Admit: 2012-09-30 | Discharge: 2012-09-30 | Disposition: A | Discharge status: Home / Self Care | Payer: Medicare Other | Attending: Emergency Medicine | Admitting: Emergency Medicine

## 2012-09-30 DIAGNOSIS — R0609 Other forms of dyspnea: Secondary | ICD-10-CM | POA: Diagnosis not present

## 2012-09-30 DIAGNOSIS — Z87891 Personal history of nicotine dependence: Secondary | ICD-10-CM | POA: Diagnosis not present

## 2012-09-30 DIAGNOSIS — Z8669 Personal history of other diseases of the nervous system and sense organs: Secondary | ICD-10-CM | POA: Insufficient documentation

## 2012-09-30 DIAGNOSIS — R0989 Other specified symptoms and signs involving the circulatory and respiratory systems: Secondary | ICD-10-CM | POA: Insufficient documentation

## 2012-09-30 DIAGNOSIS — Z8701 Personal history of pneumonia (recurrent): Secondary | ICD-10-CM | POA: Diagnosis not present

## 2012-09-30 DIAGNOSIS — J40 Bronchitis, not specified as acute or chronic: Secondary | ICD-10-CM

## 2012-09-30 DIAGNOSIS — Z87828 Personal history of other (healed) physical injury and trauma: Secondary | ICD-10-CM | POA: Diagnosis not present

## 2012-09-30 DIAGNOSIS — R52 Pain, unspecified: Secondary | ICD-10-CM | POA: Diagnosis not present

## 2012-09-30 DIAGNOSIS — R079 Chest pain, unspecified: Secondary | ICD-10-CM | POA: Insufficient documentation

## 2012-09-30 DIAGNOSIS — R509 Fever, unspecified: Secondary | ICD-10-CM | POA: Diagnosis not present

## 2012-09-30 DIAGNOSIS — J3489 Other specified disorders of nose and nasal sinuses: Secondary | ICD-10-CM | POA: Insufficient documentation

## 2012-09-30 DIAGNOSIS — H5440 Blindness, one eye, unspecified eye: Secondary | ICD-10-CM | POA: Diagnosis not present

## 2012-09-30 DIAGNOSIS — R0602 Shortness of breath: Secondary | ICD-10-CM | POA: Insufficient documentation

## 2012-09-30 DIAGNOSIS — Z79899 Other long term (current) drug therapy: Secondary | ICD-10-CM | POA: Diagnosis not present

## 2012-09-30 DIAGNOSIS — R05 Cough: Secondary | ICD-10-CM | POA: Diagnosis not present

## 2012-09-30 DIAGNOSIS — I498 Other specified cardiac arrhythmias: Secondary | ICD-10-CM | POA: Diagnosis not present

## 2012-09-30 DIAGNOSIS — J209 Acute bronchitis, unspecified: Secondary | ICD-10-CM | POA: Diagnosis not present

## 2012-09-30 MED ORDER — LEVOFLOXACIN 500 MG PO TABS: 500.0000 mg | ORAL_TABLET | Freq: Two times a day (BID) | ORAL | Status: DC

## 2012-09-30 MED ORDER — HYDROCODONE-HOMATROPINE 5-1.5 MG/5ML PO SYRP: 5.0000 mL | ORAL_SOLUTION | Freq: Four times a day (QID) | ORAL | Status: DC | PRN

## 2012-09-30 MED ORDER — PREDNISONE 20 MG PO TABS: ORAL_TABLET | ORAL | Status: DC

## 2012-09-30 NOTE — ED Provider Notes (Signed)
History    CSN: [id number] Arrival date & time 09/30/12  0750  First MD Initiated Contact with Patient 09/30/12 [phone]     Chief Complaint  Patient presents with  . Cough  . Shortness of Breath  . Chest Pain   (Consider location/radiation/quality/duration/timing/severity/associated sxs/prior Treatment) HPI...Marland KitchenMarland Kitchen productive cough, congestion, body achiness, rhinorrhea, dyspnea since Thursday. Nonsmoker. Patient has tried Mucinex and NyQuil at home with minimal relief. Severity is mild to moderate.  No fever, chills, chest pain Past Medical History  Diagnosis Date  . Blind one eye     right   . Detached retina   . Pneumonia   . Knee injury    Past Surgical History  Procedure Laterality Date  . Eye surgery     History reviewed. No pertinent family history. History  Substance Use Topics  . Smoking status: Former Smoker    Types: Cigars    Quit date: 02/28/2011  . Smokeless tobacco: Never Used  . Alcohol Use: 0.0 oz/week     Comment: socially    Review of Systems  All other systems reviewed and are negative.    Allergies  Erythromycin  Home Medications   Current Outpatient Rx  Name  Route  Sig  Dispense  Refill  . Chlorphen-Pseudoephed-APAP (ALLERGY/SINUS APAP PO)   Oral   Take 1 tablet by mouth 2 (two) times daily as needed (cough congestion).         Marland Kitchen ibuprofen (ADVIL,MOTRIN) 600 MG tablet   Oral   Take 600 mg by mouth every 8 (eight) hours as needed for pain.         Marland Kitchen lisinopril-hydrochlorothiazide (PRINZIDE,ZESTORETIC) 20-25 MG per tablet   Oral   Take 1 tablet by mouth daily.         . methocarbamol (ROBAXIN) 500 MG tablet   Oral   Take 500 mg by mouth 2 (two) times daily as needed. FOR MUSCLE SPASMS         . EXPIRED: omeprazole (PRILOSEC) 20 MG capsule   Oral   Take 40 mg by mouth daily.          BP 140/89  Pulse 86  Temp(Src) 97.7 F (36.5 C) (Oral)  Resp 16  SpO2 99% Physical Exam  Nursing note and vitals  reviewed. Constitutional: He is oriented to person, place, and time. He appears well-developed and well-nourished.  HENT:  Head: Normocephalic and atraumatic.  Eyes: Conjunctivae and EOM are normal. Puppils are equal, round, and reactive to light.  Neck: Normal range of motion. Neck supple.  Cardiovascular: Normal rate, regular rhythm and normal heart sounds.   Pulmonary/Chest: Effort normal and breath sounds normal.  Abdominal: Soft. Bowel sounds are normal.  Musculoskeletal: Normal range of motion.  Neurological: He is alert and oriented to person, place, and time.  Skin: Skin is warm and dry.  Psychiatric: He has a normal mood and affect.    ED Course  Procedures (including critical care time) Labs Reviewed - No data to display No results found. No diagnosis found.  Dg Chest 2 View (if Patient Has Fever And/or Copd)  09/30/2012   *RADIOLOGY REPORT*  Clinical Data: Cough.  Fever.  Malaise.  CHEST - 2 VIEW  Comparison:  07/27/2012  Findings:  The heart size and mediastinal contours are within normal limits.  Both lungs are clear.  The visualized skeletal structures are unremarkable.  IMPRESSION: No active cardiopulmonary disease.   Original Report Authenticated By: Myles Rosenthal, M.D.     Date:  09/30/2012  Rate: 57  Rhythm: sinus bradycardia  QRS Axis: normal  Intervals: normal  ST/T Wave abnormalities: normal  Conduction Disutrbances: none  Narrative Interpretation: unremarkable PVC   MDM  Patient is nontoxic.   Chest x-ray shows no pneumonia.  Will Rx secondary to longevity of symptoms including Levaquin, prednisone, Hycodan cough syrup  Donnetta Hutching, MD 09/30/12 [phone]

## 2012-09-30 NOTE — ED Notes (Signed)
Pt states he has had productive cough, head congestion, sore throat and body aches since Thursday. Pt states he feels sob, no distress, speaking in complete sentences. Pt complains of chest pressure since last night, worse with coughing.

## 2013-09-09 ENCOUNTER — Emergency Department (HOSPITAL_COMMUNITY)
Admission: EM | Admit: 2013-09-09 | Discharge: 2013-09-09 | Disposition: A | Discharge status: Home / Self Care | Payer: Medicare Other | Attending: Emergency Medicine | Admitting: Emergency Medicine

## 2013-09-09 ENCOUNTER — Encounter (HOSPITAL_COMMUNITY): Payer: Self-pay | Admitting: Emergency Medicine

## 2013-09-09 ENCOUNTER — Emergency Department (HOSPITAL_COMMUNITY): Payer: Medicare Other

## 2013-09-09 DIAGNOSIS — Z8669 Personal history of other diseases of the nervous system and sense organs: Secondary | ICD-10-CM | POA: Insufficient documentation

## 2013-09-09 DIAGNOSIS — Z79899 Other long term (current) drug therapy: Secondary | ICD-10-CM | POA: Insufficient documentation

## 2013-09-09 DIAGNOSIS — Z8701 Personal history of pneumonia (recurrent): Secondary | ICD-10-CM | POA: Diagnosis not present

## 2013-09-09 DIAGNOSIS — M79609 Pain in unspecified limb: Secondary | ICD-10-CM | POA: Insufficient documentation

## 2013-09-09 DIAGNOSIS — Z87891 Personal history of nicotine dependence: Secondary | ICD-10-CM | POA: Insufficient documentation

## 2013-09-09 DIAGNOSIS — IMO0002 Reserved for concepts with insufficient information to code with codable children: Secondary | ICD-10-CM | POA: Diagnosis not present

## 2013-09-09 DIAGNOSIS — M79674 Pain in right toe(s): Secondary | ICD-10-CM | POA: Diagnosis present

## 2013-09-09 DIAGNOSIS — Z792 Long term (current) use of antibiotics: Secondary | ICD-10-CM | POA: Diagnosis not present

## 2013-09-09 DIAGNOSIS — Z87828 Personal history of other (healed) physical injury and trauma: Secondary | ICD-10-CM | POA: Diagnosis not present

## 2013-09-09 NOTE — ED Notes (Signed)
Pt c/o rt great toe pain x 2 days.  States that he knows that he injured it but cannot remember how.

## 2013-09-09 NOTE — ED Provider Notes (Signed)
CSN: [id number]     Arrival date & time 09/09/13  0846 History   First MD Initiated Contact with Patient 09/09/13 1037     Chief Complaint  Patient presents with  . Toe Pain     (Consider location/radiation/quality/duration/timing/severity/associated sxs/prior Treatment) Patient is a 43 y.o. male presenting with toe pain. The history is provided by the patient.  Toe Pain This is a new problem. The current episode started 2 days ago. The problem occurs constantly. The problem has not changed since onset.Pertinent negatives include no chest pain, no abdominal pain, no headaches and no shortness of breath. The symptoms are aggravated by walking. Nothing relieves the symptoms. He has tried nothing for the symptoms. The treatment provided no relief.    Past Medical History  Diagnosis Date  . Blind one eye     right   . Detached retina   . Pneumonia   . Knee injury    Past Surgical History  Procedure Laterality Date  . Eye surgery     No family history on file. History  Substance Use Topics  . Smoking status: Former Smoker    Types: Cigars    Quit date: 02/28/2011  . Smokeless tobacco: Never Used  . Alcohol Use: 0.0 oz/week     Comment: socially    Review of Systems  Constitutional: Negative for fever.  HENT: Negative for drooling and rhinorrhea.   Eyes: Negative for pain.  Respiratory: Negative for cough and shortness of breath.   Cardiovascular: Negative for chest pain and leg swelling.  Gastrointestinal: Negative for nausea, vomiting, abdominal pain and diarrhea.  Genitourinary: Negative for dysuria and hematuria.  Musculoskeletal: Negative for gait problem and neck pain.  Skin: Negative for color change.  Neurological: Negative for numbness and headaches.  Hematological: Negative for adenopathy.  Psychiatric/Behavioral: Negative for behavioral problems.  All other systems reviewed and are negative.     Allergies  Erythromycin and Other  Home Medications    Prior to Admission medications   Medication Sig Start Date End Date Taking? Authorizing Provider  Chlorphen-Pseudoephed-APAP (ALLERGY/SINUS APAP PO) Take 1 tablet by mouth 2 (two) times daily as needed (cough congestion).    Historical Provider, MD  guaiFENesin (MUCINEX) 600 MG 12 hr tablet Take 1,200 mg by mouth 2 (two) times daily.    Historical Provider, MD  HYDROcodone-homatropine (HYCODAN) 5-1.5 MG/5ML syrup Take 5 mLs by mouth every 6 (six) hours as needed for cough. 09/30/12   Donnetta HutchingBrian Cook, MD  ibuprofen (ADVIL,MOTRIN) 600 MG tablet Take 600 mg by mouth every 8 (eight) hours as needed for pain.    Historical Provider, MD  levofloxacin (LEVAQUIN) 500 MG tablet Take 1 tablet (500 mg total) by mouth 2 (two) times daily. 09/30/12   Donnetta HutchingBrian Cook, MD  lisinopril-hydrochlorothiazide (PRINZIDE,ZESTORETIC) 20-25 MG per tablet Take 1 tablet by mouth daily.    Historical Provider, MD  omeprazole (PRILOSEC) 20 MG capsule Take 40 mg by mouth daily. 06/15/11 09/30/12  Gwenlyn FoundJessica C Copland, MD  predniSONE (DELTASONE) 20 MG tablet 3 tabs po day one, then 2 po daily x 4 days 09/30/12   Donnetta HutchingBrian Cook, MD   BP 152/65  Pulse 76  Temp(Src) 98.3 F (36.8 C) (Oral)  Resp 18  SpO2 100% Physical Exam  Nursing note and vitals reviewed. Constitutional: He is oriented to person, place, and time. He appears well-developed and well-nourished.  HENT:  Head: Normocephalic and atraumatic.  Right Ear: External ear normal.  Left Ear: External ear normal.  Nose: Nose  normal.  Mouth/Throat: Oropharynx is clear and moist. No oropharyngeal exudate.  Eyes: Conjunctivae and EOM are normal. Pupils are equal, round, and reactive to light.  Neck: Normal range of motion. Neck supple.  Cardiovascular: Normal rate, regular rhythm, normal heart sounds and intact distal pulses.  Exam reveals no gallop and no friction rub.   No murmur heard. Pulmonary/Chest: Effort normal and breath sounds normal. No respiratory distress. He has no wheezes.   Abdominal: Soft. Bowel sounds are normal. He exhibits no distension. There is no tenderness. There is no rebound and no guarding.  Musculoskeletal: Normal range of motion. He exhibits no edema and no tenderness.  Mild tenderness to palpation at the first MTP joint of the right foot. Normal range of motion of the toes and ankle of the right foot. Normal appearance of the right foot.  2+ distal pulses in bilateral lower extremities.  Neurological: He is alert and oriented to person, place, and time.  Skin: Skin is warm and dry.  Psychiatric: He has a normal mood and affect. His behavior is normal.    ED Course  Procedures (including critical care time) Labs Review Labs Reviewed - No data to display  Imaging Review Dg Foot Complete Right  09/09/2013   CLINICAL DATA:  Two days of medial and plantar right foot pain  EXAM: RIGHT FOOT COMPLETE - 3+ VIEW  COMPARISON:  None.  FINDINGS: The bones are adequately mineralized. There is no acute or healing fracture or other acute bony abnormality. There is a tiny plantar calcaneal spur. The soft tissues are normal.  IMPRESSION: There is no acute bony abnormality of the right foot.   Electronically Signed   By: David  SwazilandJordan   On: 09/09/2013 09:21     EKG Interpretation None      MDM   Final diagnoses:  Pain of right great toe    10:52 AM 43 y.o. male presents with right great toe pain which began 2 days ago. The patient believes he may have hyperextended the toe while walking. He denies any other obvious injury. His right foot is normal in appearance without any cutaneous abnormalities or erythema. He denies any fevers or other associated symptoms. Plain film is negative. Will recommend symptomatic therapy and RICE.   10:53 AM: Doubt gout or septic joint. Likely msk injury.  I have discussed the diagnosis/risks/treatment options with the patient and believe the pt to be eligible for discharge home to follow-up with pcp as needed. We also  discussed returning to the ED immediately if new or worsening sx occur. We discussed the sx which are most concerning (e.g., worsening pain) that necessitate immediate return. Medications administered to the patient during their visit and any new prescriptions provided to the patient are listed below.  Medications given during this visit Medications - No data to display  New Prescriptions   No medications on file       Junius ArgyleForrest S Elieser Tetrick, MD 09/09/13 1056

## 2013-09-11 ENCOUNTER — Ambulatory Visit (INDEPENDENT_AMBULATORY_CARE_PROVIDER_SITE_OTHER): Payer: Medicare Other

## 2013-09-11 ENCOUNTER — Ambulatory Visit (INDEPENDENT_AMBULATORY_CARE_PROVIDER_SITE_OTHER): Payer: Medicare Other | Admitting: Emergency Medicine

## 2013-09-11 VITALS — BP 160/90 | HR 82 | Temp 98.2°F | Resp 20 | Ht 75.0 in | Wt 262.0 lb

## 2013-09-11 DIAGNOSIS — I10 Essential (primary) hypertension: Secondary | ICD-10-CM | POA: Diagnosis not present

## 2013-09-11 DIAGNOSIS — M79609 Pain in unspecified limb: Secondary | ICD-10-CM

## 2013-09-11 DIAGNOSIS — M79671 Pain in right foot: Secondary | ICD-10-CM

## 2013-09-11 DIAGNOSIS — M25529 Pain in unspecified elbow: Secondary | ICD-10-CM

## 2013-09-11 DIAGNOSIS — M25521 Pain in right elbow: Secondary | ICD-10-CM

## 2013-09-11 DIAGNOSIS — E785 Hyperlipidemia, unspecified: Secondary | ICD-10-CM | POA: Diagnosis not present

## 2013-09-11 DIAGNOSIS — Z0000 Encounter for general adult medical examination without abnormal findings: Secondary | ICD-10-CM | POA: Diagnosis not present

## 2013-09-11 LAB — POCT CBC
GRANULOCYTE PERCENT: 60.1 % (ref 37–80)
HEMATOCRIT: 45.7 % (ref 43.5–53.7)
Hemoglobin: 14.8 g/dL (ref 14.1–18.1)
Lymph, poc: 1.5 (ref 0.6–3.4)
MCH, POC: 25.7 pg — AB (ref 27–31.2)
MCHC: 32.4 g/dL (ref 31.8–35.4)
MCV: 79.4 fL — AB (ref 80–97)
MID (cbc): 0.3 (ref 0–0.9)
MPV: 11.3 fL (ref 0–99.8)
PLATELET COUNT, POC: 192 10*3/uL (ref 142–424)
POC GRANULOCYTE: 2.8 (ref 2–6.9)
POC LYMPH %: 33.1 % (ref 10–50)
POC MID %: 6.8 %M (ref 0–12)
RBC: 5.76 M/uL (ref 4.69–6.13)
RDW, POC: 15.5 %
WBC: 4.6 10*3/uL (ref 4.6–10.2)

## 2013-09-11 MED ORDER — MELOXICAM 15 MG PO TABS: 15.0000 mg | ORAL_TABLET | Freq: Every day | ORAL | Status: DC

## 2013-09-11 NOTE — Patient Instructions (Signed)
Hypertension Hypertension, commonly called high blood pressure, is when the force of blood pumping through your arteries is too strong. Your arteries are the blood vessels that carry blood from your heart throughout your body. A blood pressure reading consists of a higher number over a lower number, such as 110/72. The higher number (systolic) is the pressure inside your arteries when your heart pumps. The lower number (diastolic) is the pressure inside your arteries when your heart relaxes. Ideally you want your blood pressure below 120/80. Hypertension forces your heart to work harder to pump blood. Your arteries may become narrow or stiff. Having hypertension puts you at risk for heart disease, stroke, and other problems.  RISK FACTORS Some risk factors for high blood pressure are controllable. Others are not.  Risk factors you cannot control include:   Race. You may be at higher risk if you are African American.  Age. Risk increases with age.  Gender. Men are at higher risk than women before age 45 years. After age 65, women are at higher risk than men. Risk factors you can control include:  Not getting enough exercise or physical activity.  Being overweight.  Getting too much fat, sugar, calories, or salt in your diet.  Drinking too much alcohol. SIGNS AND SYMPTOMS Hypertension does not usually cause signs or symptoms. Extremely high blood pressure (hypertensive crisis) may cause headache, anxiety, shortness of breath, and nosebleed. DIAGNOSIS  To check if you have hypertension, your health care provider will measure your blood pressure while you are seated, with your arm held at the level of your heart. It should be measured at least twice using the same arm. Certain conditions can cause a difference in blood pressure between your right and left arms. A blood pressure reading that is higher than normal on one occasion does not mean that you need treatment. If one blood pressure reading  is high, ask your health care provider about having it checked again. TREATMENT  Treating high blood pressure includes making lifestyle changes and possibly taking medication. Living a healthy lifestyle can help lower high blood pressure. You may need to change some of your habits. Lifestyle changes may include:  Following the DASH diet. This diet is high in fruits, vegetables, and whole grains. It is low in salt, red meat, and added sugars.  Getting at least 2 1/2 hours of brisk physical activity every week.  Losing weight if necessary.  Not smoking.  Limiting alcoholic beverages.  Learning ways to reduce stress. If lifestyle changes are not enough to get your blood pressure under control, your health care provider may prescribe medicine. You may need to take more than one. Work closely with your health care provider to understand the risks and benefits. HOME CARE INSTRUCTIONS  Have your blood pressure rechecked as directed by your health care provider.   Only take medicine as directed by your health care provider. Follow the directions carefully. Blood pressure medicines must be taken as prescribed. The medicine does not work as well when you skip doses. Skipping doses also puts you at risk for problems.   Do not smoke.   Monitor your blood pressure at home as directed by your health care provider. SEEK MEDICAL CARE IF:   You think you are having a reaction to medicines taken.  You have recurrent headaches or feel dizzy.  You have swelling in your ankles.  You have trouble with your vision. SEEK IMMEDIATE MEDICAL CARE IF:  You develop a severe headache or   confusion.  You have unusual weakness, numbness, or feel faint.  You have severe chest or abdominal pain.  You vomit repeatedly.  You have trouble breathing. MAKE SURE YOU:   Understand these instructions.  Will watch your condition.  Will get help right away if you are not doing well or get  worse. Document Released: 03/12/2005 Document Revised: 03/17/2013 Document Reviewed: 01/02/2013 [employer] Patient Information 2015 [employer]. This information is not intended to replace advice given to you by your health care provider. Make sure you discuss any questions you have with your health care provider.  

## 2013-09-11 NOTE — Progress Notes (Signed)
   Subjective:    Patient ID: Richard Bautista, male    DOB: 06/18/1970, 43 y.o.   MRN: [id number]  HPI 43 year old male pt presents for a complete physical and discuss his BP. Patient also complains about right foot pain. He thinks he "hyperextended his foot." His BP today was 160/90 when triaged. When BP repeated in room it was 120/80. He stopped taking his BP medication "a while ago." Not a smoker and an occasional drinker. May have 2 or 3 alcoholic beverages on the weekend. Pt is self employed. Pt is on medicare for blindness in his right eye. 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 Up to date on immunizations. Last tetanus was about 4 years ago.    Review of Systems     Objective:   Physical Exam HEENT exam is unremarkable there is an iridectomy scar of the right eye  and patient is blind in the right eye.. Neck is supple. Chest clear to auscultation and percussion. Heart regular rate no murmur. Abdomen soft nontender. Genital exam reveals no hernias testicles normal.  [facility] reading (PRIMARY) by  Dr.Daub there are mild degenerative changes at the MTP joint of the great toe. Elbow films show no abnormalities. On one view there is a linear vertical radiolucency of the radial head however patient is tender medially and out laterally.  Results for orders placed in visit on 09/11/13  POCT CBC      Result Value Ref Range   WBC 4.6  4.6 - 10.2 K/uL   Lymph, poc 1.5  0.6 - 3.4   POC LYMPH PERCENT 33.1  10 - 50 %L   MID (cbc) 0.3  0 - 0.9   POC MID % 6.8  0 - 12 %M   POC Granulocyte 2.8  2 - 6.9   Granulocyte percent 60.1  37 - 80 %G   RBC 5.76  4.69 - 6.13 M/uL   Hemoglobin 14.8  14.1 - 18.1 g/dL   HCT, POC 40.945.7  81.143.5 - 53.7 %   MCV 79.4 (*) 80 - 97 fL   MCH, POC 25.7 (*) 27 - 31.2 pg   MCHC 32.4  31.8 - 35.4 g/dL   RDW, POC 91.415.5     Platelet Count, POC 192  142 - 424 K/uL   MPV 11.3  0 - 99.8 fL       Assessment & Plan:  Repeat blood pressure was 144/90.. I  advised him to get some inserts for his shoes. He was placed on Mobic to take one a day for 10 days for his toe pain. He needs to be aggressive about weight loss and exercise we'll hold off on medication at the present time.

## 2013-09-12 LAB — COMPREHENSIVE METABOLIC PANEL
ALBUMIN: 4.7 g/dL (ref 3.5–5.2)
ALK PHOS: 55 U/L (ref 39–117)
ALT: 13 U/L (ref 0–53)
AST: 34 U/L (ref 0–37)
BUN: 12 mg/dL (ref 6–23)
CO2: 27 mEq/L (ref 19–32)
Calcium: 10.2 mg/dL (ref 8.4–10.5)
Chloride: 101 mEq/L (ref 96–112)
Creat: 1.56 mg/dL — ABNORMAL HIGH (ref 0.50–1.35)
GLUCOSE: 87 mg/dL (ref 70–99)
POTASSIUM: 4.6 meq/L (ref 3.5–5.3)
SODIUM: 138 meq/L (ref 135–145)
TOTAL PROTEIN: 7.7 g/dL (ref 6.0–8.3)
Total Bilirubin: 0.5 mg/dL (ref 0.2–1.2)

## 2013-09-12 LAB — LIPID PANEL
Cholesterol: 214 mg/dL — ABNORMAL HIGH (ref 0–200)
HDL: 43 mg/dL (ref >39)
LDL CALC: 139 mg/dL — AB (ref 0–99)
Total CHOL/HDL Ratio: 5 Ratio
Triglycerides: 162 mg/dL — ABNORMAL HIGH (ref <150)
VLDL: 32 mg/dL (ref 0–40)

## 2013-09-12 LAB — TSH: TSH: 1.888 u[IU]/mL (ref 0.350–4.500)

## 2013-09-12 LAB — URIC ACID: Uric Acid, Serum: 8.9 mg/dL — ABNORMAL HIGH (ref 4.0–7.8)

## 2013-09-15 ENCOUNTER — Other Ambulatory Visit: Payer: Self-pay | Admitting: Radiology

## 2013-09-15 DIAGNOSIS — M109 Gout, unspecified: Secondary | ICD-10-CM

## 2013-09-15 MED ORDER — COLCHICINE 0.6 MG PO TABS: 0.6000 mg | ORAL_TABLET | Freq: Two times a day (BID) | ORAL | Status: DC

## 2013-11-20 ENCOUNTER — Encounter (HOSPITAL_COMMUNITY): Payer: Self-pay | Admitting: Emergency Medicine

## 2013-11-20 ENCOUNTER — Emergency Department (HOSPITAL_COMMUNITY): Payer: Medicare Other

## 2013-11-20 ENCOUNTER — Emergency Department (HOSPITAL_COMMUNITY)
Admission: EM | Admit: 2013-11-20 | Discharge: 2013-11-20 | Disposition: A | Discharge status: Home / Self Care | Payer: Medicare Other | Attending: Emergency Medicine | Admitting: Emergency Medicine

## 2013-11-20 DIAGNOSIS — Y939 Activity, unspecified: Secondary | ICD-10-CM | POA: Insufficient documentation

## 2013-11-20 DIAGNOSIS — W57XXXA Bitten or stung by nonvenomous insect and other nonvenomous arthropods, initial encounter: Secondary | ICD-10-CM

## 2013-11-20 DIAGNOSIS — Y929 Unspecified place or not applicable: Secondary | ICD-10-CM | POA: Diagnosis not present

## 2013-11-20 DIAGNOSIS — R079 Chest pain, unspecified: Secondary | ICD-10-CM | POA: Diagnosis not present

## 2013-11-20 DIAGNOSIS — Z87891 Personal history of nicotine dependence: Secondary | ICD-10-CM | POA: Insufficient documentation

## 2013-11-20 DIAGNOSIS — M7989 Other specified soft tissue disorders: Secondary | ICD-10-CM | POA: Diagnosis not present

## 2013-11-20 DIAGNOSIS — H5440 Blindness, one eye, unspecified eye: Secondary | ICD-10-CM | POA: Diagnosis not present

## 2013-11-20 DIAGNOSIS — Z87828 Personal history of other (healed) physical injury and trauma: Secondary | ICD-10-CM | POA: Insufficient documentation

## 2013-11-20 DIAGNOSIS — Z79899 Other long term (current) drug therapy: Secondary | ICD-10-CM | POA: Insufficient documentation

## 2013-11-20 DIAGNOSIS — T63461A Toxic effect of venom of wasps, accidental (unintentional), initial encounter: Secondary | ICD-10-CM | POA: Diagnosis not present

## 2013-11-20 DIAGNOSIS — S60569A Insect bite (nonvenomous) of unspecified hand, initial encounter: Secondary | ICD-10-CM | POA: Diagnosis not present

## 2013-11-20 DIAGNOSIS — Z791 Long term (current) use of non-steroidal anti-inflammatories (NSAID): Secondary | ICD-10-CM | POA: Diagnosis not present

## 2013-11-20 DIAGNOSIS — T6391XA Toxic effect of contact with unspecified venomous animal, accidental (unintentional), initial encounter: Secondary | ICD-10-CM | POA: Insufficient documentation

## 2013-11-20 DIAGNOSIS — Z8701 Personal history of pneumonia (recurrent): Secondary | ICD-10-CM | POA: Insufficient documentation

## 2013-11-20 LAB — CBC WITH DIFFERENTIAL/PLATELET
BASOS ABS: 0 10*3/uL (ref 0.0–0.1)
Basophils Relative: 1 % (ref 0–1)
Eosinophils Absolute: 0.1 10*3/uL (ref 0.0–0.7)
Eosinophils Relative: 2 % (ref 0–5)
HCT: 44.2 % (ref 39.0–52.0)
Hemoglobin: 14.6 g/dL (ref 13.0–17.0)
LYMPHS ABS: 1.3 10*3/uL (ref 0.7–4.0)
Lymphocytes Relative: 29 % (ref 12–46)
MCH: 25 pg — ABNORMAL LOW (ref 26.0–34.0)
MCHC: 33 g/dL (ref 30.0–36.0)
MCV: 75.6 fL — ABNORMAL LOW (ref 78.0–100.0)
Monocytes Absolute: 0.3 10*3/uL (ref 0.1–1.0)
Monocytes Relative: 7 % (ref 3–12)
NEUTROS ABS: 2.9 10*3/uL (ref 1.7–7.7)
NEUTROS PCT: 62 % (ref 43–77)
Platelets: 161 10*3/uL (ref 150–400)
RBC: 5.85 MIL/uL — AB (ref 4.22–5.81)
RDW: 14.2 % (ref 11.5–15.5)
WBC: 4.7 10*3/uL (ref 4.0–10.5)

## 2013-11-20 LAB — BASIC METABOLIC PANEL
Anion gap: 13 (ref 5–15)
BUN: 16 mg/dL (ref 6–23)
CALCIUM: 9.7 mg/dL (ref 8.4–10.5)
CO2: 25 meq/L (ref 19–32)
Chloride: 100 mEq/L (ref 96–112)
Creatinine, Ser: 1.36 mg/dL — ABNORMAL HIGH (ref 0.50–1.35)
GFR calc Af Amer: 72 mL/min — ABNORMAL LOW (ref >90)
GFR calc non Af Amer: 62 mL/min — ABNORMAL LOW (ref >90)
Glucose, Bld: 97 mg/dL (ref 70–99)
POTASSIUM: 4.6 meq/L (ref 3.7–5.3)
SODIUM: 138 meq/L (ref 137–147)

## 2013-11-20 LAB — I-STAT TROPONIN, ED
TROPONIN I, POC: 0 ng/mL (ref 0.00–0.08)
Troponin i, poc: 0 ng/mL (ref 0.00–0.08)

## 2013-11-20 MED ORDER — DIPHENHYDRAMINE HCL 25 MG PO TABS: 25.0000 mg | ORAL_TABLET | Freq: Four times a day (QID) | ORAL | Status: DC | PRN

## 2013-11-20 MED ORDER — ASPIRIN 81 MG PO CHEW
324.0000 mg | CHEWABLE_TABLET | Freq: Once | ORAL | Status: AC
  Administered 2013-11-20: 324 mg via ORAL
  Filled 2013-11-20: qty 4

## 2013-11-20 MED ORDER — DIPHENHYDRAMINE HCL 25 MG PO CAPS
50.0000 mg | ORAL_CAPSULE | Freq: Once | ORAL | Status: AC
  Administered 2013-11-20: 50 mg via ORAL
  Filled 2013-11-20: qty 2

## 2013-11-20 NOTE — Discharge Instructions (Signed)
Bee, Wasp, or Hornet Sting Your caregiver has diagnosed you as having an insect sting. An insect sting appears as a red lump in the skin that sometimes has a tiny hole in the center, or it may have a stinger in the center of the wound. The most common stings are from wasps, hornets and bees. Individuals have different reactions to insect stings.  A normal reaction may cause pain, swelling, and redness around the sting site.  A localized allergic reaction may cause swelling and redness that extends beyond the sting site.  A large local reaction may continue to develop over the next 12 to 36 hours.  On occasion, the reactions can be severe (anaphylactic reaction). An anaphylactic reaction may cause wheezing; difficulty breathing; chest pain; fainting; raised, itchy, red patches on the skin; a sick feeling to your stomach (nausea); vomiting; cramping; or diarrhea. If you have had an anaphylactic reaction to an insect sting in the past, you are more likely to have one again. HOME CARE INSTRUCTIONS   With bee stings, a small sac of poison is left in the wound. Brushing across this with something such as a credit card, or anything similar, will help remove this and decrease the amount of the reaction. This same procedure will not help a wasp sting as they do not leave behind a stinger and poison sac.  Apply a cold compress for 10 to 20 minutes every hour for 1 to 2 days, depending on severity, to reduce swelling and itching.  To lessen pain, a paste made of water and baking soda may be rubbed on the bite or sting and left on for 5 minutes.  To relieve itching and swelling, you may use take medication or apply medicated creams or lotions as directed.  Only take over-the-counter or prescription medicines for pain, discomfort, or fever as directed by your caregiver.  Wash the sting site daily with soap and water. Apply antibiotic ointment on the sting site as directed.  If you suffered a severe  reaction:  If you did not require hospitalization, an adult will need to stay with you for 24 hours in case the symptoms return.  You may need to wear a medical bracelet or necklace stating the allergy.  You and your family need to learn when and how to use an anaphylaxis kit or epinephrine injection.  If you have had a severe reaction before, always carry your anaphylaxis kit with you. SEEK MEDICAL CARE IF:   None of the above helps within 2 to 3 days.  The area becomes red, warm, tender, and swollen beyond the area of the bite or sting.  You have an oral temperature above 102 F (38.9 C). SEEK IMMEDIATE MEDICAL CARE IF:  You have symptoms of an allergic reaction which are:  Wheezing.  Difficulty breathing.  Chest pain.  Lightheadedness or fainting.  Itchy, raised, red patches on the skin.  Nausea, vomiting, cramping or diarrhea. ANY OF THESE SYMPTOMS MAY REPRESENT A SERIOUS PROBLEM THAT IS AN EMERGENCY. Do not wait to see if the symptoms will go away. Get medical help right away. Call your local emergency services (911 in [place] DO NOT drive yourself to the hospital. MAKE SURE YOU:   Understand these instructions.  Will watch your condition.  Will get help right away if you are not doing well or get worse. Document Released: 03/12/2005 Document Revised: 06/04/2011 Document Reviewed: 08/27/2009 [facility] Patient Information 2015 [place], [place]. This information is not intended to replace advice given  to you by your health care provider. Make sure you discuss any questions you have with your health care provider. Chest Pain (Nonspecific) It is often hard to give a specific diagnosis for the cause of chest pain. There is always a chance that your pain could be related to something serious, such as a heart attack or a blood clot in the lungs. You need to follow up with your health care provider for further evaluation. CAUSES   Heartburn.  Pneumonia or  bronchitis.  Anxiety or stress.  Inflammation around your heart (pericarditis) or lung (pleuritis or pleurisy).  A blood clot in the lung.  A collapsed lung (pneumothorax). It can develop suddenly on its own (spontaneous pneumothorax) or from trauma to the chest.  Shingles infection (herpes zoster virus). The chest wall is composed of bones, muscles, and cartilage. Any of these can be the source of the pain.  The bones can be bruised by injury.  The muscles or cartilage can be strained by coughing or overwork.  The cartilage can be affected by inflammation and become sore (costochondritis). DIAGNOSIS  Lab tests or other studies may be needed to find the cause of your pain. Your health care provider may have you take a test called an ambulatory electrocardiogram (ECG). An ECG records your heartbeat patterns over a 24-hour period. You may also have other tests, such as:  Transthoracic echocardiogram (TTE). During echocardiography, sound waves are used to evaluate how blood flows through your heart.  Transesophageal echocardiogram (TEE).  Cardiac monitoring. This allows your health care provider to monitor your heart rate and rhythm in real time.  Holter monitor. This is a portable device that records your heartbeat and can help diagnose heart arrhythmias. It allows your health care provider to track your heart activity for several days, if needed.  Stress tests by exercise or by giving medicine that makes the heart beat faster. TREATMENT   Treatment depends on what may be causing your chest pain. Treatment may include:  Acid blockers for heartburn.  Anti-inflammatory medicine.  Pain medicine for inflammatory conditions.  Antibiotics if an infection is present.  You may be advised to change lifestyle habits. This includes stopping smoking and avoiding alcohol, caffeine, and chocolate.  You may be advised to keep your head raised (elevated) when sleeping. This reduces the  chance of acid going backward from your stomach into your esophagus. Most of the time, nonspecific chest pain will improve within 2-3 days with rest and mild pain medicine.  HOME CARE INSTRUCTIONS   If antibiotics were prescribed, take them as directed. Finish them even if you start to feel better.  For the next few days, avoid physical activities that bring on chest pain. Continue physical activities as directed.  Do not use any tobacco products, including cigarettes, chewing tobacco, or electronic cigarettes.  Avoid drinking alcohol.  Only take medicine as directed by your health care provider.  Follow your health care provider's suggestions for further testing if your chest pain does not go away.  Keep any follow-up appointments you made. If you do not go to an appointment, you could develop lasting (chronic) problems with pain. If there is any problem keeping an appointment, call to reschedule. SEEK MEDICAL CARE IF:   Your chest pain does not go away, even after treatment.  You have a rash with blisters on your chest.  You have a fever. SEEK IMMEDIATE MEDICAL CARE IF:   You have increased chest pain or pain that spreads to your  arm, neck, jaw, back, or abdomen.  You have shortness of breath.  You have an increasing cough, or you cough up blood.  You have severe back or abdominal pain.  You feel nauseous or vomit.  You have severe weakness.  You faint.  You have chills. This is an emergency. Do not wait to see if the pain will go away. Get medical help at once. Call your local emergency services (911 in [place] Do not drive yourself to the hospital. MAKE SURE YOU:   Understand these instructions.  Will watch your condition.  Will get help right away if you are not doing well or get worse. Document Released: 12/20/2004 Document Revised: 03/17/2013 Document Reviewed: 10/16/2007 [facility] Patient Information 2015 [place], [place]. This information is not intended to  replace advice given to you by your health care provider. Make sure you discuss any questions you have with your health care provider.

## 2013-11-20 NOTE — Progress Notes (Signed)
  CARE MANAGEMENT ED NOTE 11/20/2013  Patient:  Richard Bautista   Account Number:  [id number]  Date Initiated:  11/20/2013  Documentation initiated by:  Edd Arbour  Subjective/Objective Assessment:   43 yr old [place] medicare [place] pt was unable to get out of her recliner in which she was stuck for 1 day. During the transfer to EMS stretcher, pt felt severe pain in right knee 5/10. Hx of CHF, COPD, Cellulitis     Subjective/Objective Assessment Detail:   wt 343 lbs  Pt seen by [facility] care for [facility] PT/OT RN aide  12/01/13 has f/u with Dr Oneta Rack Had calls to pcp office about uti s/s after d/c home from snf  Dr Sedonia Small seen last Thursday 11/12/13  was wound care with dr Sedonia Small and  he saw her at [facility]  Pt states she was on cipro for uti Reports d/c from [facility] with iv antibiotic and Rehab needed at [facility]     Action/Plan:   see notes below  CM spoke with pt to assess re admission information entered in SW consult Pt wants to return to [facility] snf at d/c from [facility]   Action/Plan Detail:   Spoke with her older sister at bedside   Anticipated DC Date:  11/21/2013     Status Recommendation to Physician:   Result of Recommendation:    Other ED Services  Consult Working Plan   In-house referral  Clinical Social Worker   DC Planning Services  Other   [facility] Choice  HOME HEALTH   Choice offered to / List presented to:  C-1 Patient     HH arranged  HH-1 RN  HH-2 PT  HH-3 OT  HH-4 NURSE'S AIDE      HH agency  [facility]    Status of service:  Completed, signed off  ED Comments:   ED Comments Detail:  Ophelia Shoulder, RN Case Manager Addendum CASE MANAGEMENT Progress Notes Service date: 11/20/2013 2:36 PM ED CM left a voice message for ED SW at 1408 about EDP/PA/NP concern with family request to have pt in facility Pt was d/c from [facility] snf Return call from ED SW who will contact [facility] 1410 Spoke with Eunice Blase of [place] to  confirm pt was d/c from [facility] with [place] home health services but has been noncompliant & has refused services from [facility] staff members. If pt refuses 3 times the agency per standard discharges pt from their services 1421 CM Spoke with amy of [facility] at [phone], Amy reports pt last seen by RN & Aide on 11/18/13.Myrene Buddy states pt was not seen by PT therefore there are not any PT notes.   1436 enteree order for PT evaluation and spoke with Loni Beckwith PT to request assistance

## 2013-11-20 NOTE — ED Provider Notes (Signed)
Medical screening examination/treatment/procedure(s) were performed by non-physician practitioner and as supervising physician I was immediately available for consultation/collaboration.   EKG Interpretation   Date/Time:  Friday November 20 2013 17:31:21 EDT Ventricular Rate:  65 PR Interval:  182 QRS Duration: 92 QT Interval:  396 QTC Calculation: 411 R Axis:   59 Text Interpretation:  Normal sinus rhythm Nonspecific ST and T wave  abnormality Abnormal ECG Confirmed by WARD,  DO, KRISTEN ([id number]) on  11/20/2013 5:50:34 PM        Layla Maw Ward, DO 11/20/13 2123

## 2013-11-20 NOTE — ED Notes (Signed)
Richard Bautista at bedside attempting IV for blood draw at present time.

## 2013-11-20 NOTE — ED Notes (Signed)
Pt was stung yesterday on his left hand. Pt has swelling and redness to left hand. States that he has been having some SOB. 02 96% on room air and RR 16.  Denies epi-pen or past history of allergies to bees.

## 2013-11-20 NOTE — ED Provider Notes (Signed)
CSN: [id number] Arrival date & time 11/20/13  1232 History   First MD Initiated Contact with Patient 11/20/13 1459     Chief Complaint  Patient presents with  . Insect Bite     (Consider location/radiation/quality/duration/timing/severity/associated sxs/prior Treatment) HPI Comments: Patient presents to the ED with two complaints.  1. Chest pain/SOB.  Patient came to the ED for evaluation of bee sting, but states that since arrival ~12:00 he has begun to have chest pain and SOB.  No hx of heart disease.  No known cardiac risk factors.  Patient states that the pain is in the center of his chest. It does not radiate. He states that it comes and goes.  He has not had anything like this before.  There is no exertional component.  He states that he is uncertain if this is related to his bee sting.  2.  Bee sting.  Patient originally presented for evaluation of bee sting.  States that he was stung yesterday on the left had.  Reports associated pain, burning, and itching.  He denies any history of allergies to bee stings.  He denies any SOB, throat tightness, or other symptoms immediately following the sting.  He states that he has tried taking ibuprofen last night with minimal relief.  There are no aggravating or alleviating factors.  The history is provided by the patient. No language interpreter was used.    Past Medical History  Diagnosis Date  . Blind one eye     right   . Detached retina   . Pneumonia   . Knee injury    Past Surgical History  Procedure Laterality Date  . Eye surgery     History reviewed. No pertinent family history. History  Substance Use Topics  . Smoking status: Former Smoker    Types: Cigars    Quit date: 02/28/2011  . Smokeless tobacco: Never Used  . Alcohol Use: 0.0 oz/week     Comment: socially    Review of Systems  Constitutional: Negative for fever and chills.  Respiratory: Positive for shortness of breath.   Cardiovascular: Positive for chest  pain.  Gastrointestinal: Negative for nausea, vomiting, diarrhea and constipation.  Genitourinary: Negative for dysuria.  Skin: Positive for rash.  All other systems reviewed and are negative.     Allergies  Erythromycin and Other  Home Medications   Prior to Admission medications   Medication Sig Start Date End Date Taking? Authorizing Provider  colchicine 0.6 MG tablet Take 1 tablet (0.6 mg total) by mouth 2 (two) times daily. 09/15/13   Collene Gobble, MD  ibuprofen (ADVIL,MOTRIN) 200 MG tablet Take 200 mg by mouth every 6 (six) hours as needed for mild pain.    Historical Provider, MD  lisinopril (PRINIVIL,ZESTRIL) 10 MG tablet Take 10 mg by mouth daily.    Historical Provider, MD  meloxicam (MOBIC) 15 MG tablet Take 1 tablet (15 mg total) by mouth daily. 09/11/13   Collene Gobble, MD  omeprazole (PRILOSEC) 20 MG capsule Take 40 mg by mouth daily. 06/15/11 09/30/12  Gwenlyn Found Copland, MD   BP 144/79  Pulse 72  Temp(Src) 98.2 F (36.8 C) (Oral)  Resp 16  SpO2 96% Physical Exam  Nursing note and vitals reviewed. Constitutional: He is oriented to person, place, and time. He appears well-developed and well-nourished.  HENT:  Head: Normocephalic and atraumatic.  Oropharynx is clear, no evidence of oral swelling or airway obstruction  Eyes: Conjunctivae and EOM are  normal. Pupils are equal, round, and reactive to light. Right eye exhibits no discharge. Left eye exhibits no discharge. No scleral icterus.  Neck: Normal range of motion. Neck supple. No JVD present.  Cardiovascular: Normal rate, regular rhythm and normal heart sounds.  Exam reveals no gallop and no friction rub.   No murmur heard. Pulmonary/Chest: Effort normal and breath sounds normal. No respiratory distress. He has no wheezes. He has no rales. He exhibits no tenderness.  CTAB  Abdominal: Soft. He exhibits no distension.  Musculoskeletal: Normal range of motion. He exhibits no edema and no tenderness.  Left hand ROM  and strength 5/5  Neurological: He is alert and oriented to person, place, and time.  Skin: Skin is warm and dry.  Moderate warmth and erythema to left hand about the dorsal aspect, mildly ttp, no open wounds  Psychiatric: He has a normal mood and affect. His behavior is normal. Judgment and thought content normal.    ED Course  Procedures (including critical care time) Results for orders placed during the hospital encounter of 11/20/13  CBC WITH DIFFERENTIAL      Result Value Ref Range   WBC 4.7  4.0 - 10.5 K/uL   RBC 5.85 (*) 4.22 - 5.81 MIL/uL   Hemoglobin 14.6  13.0 - 17.0 g/dL   HCT 16.1  09.6 - 04.5 %   MCV 75.6 (*) 78.0 - 100.0 fL   MCH 25.0 (*) 26.0 - 34.0 pg   MCHC 33.0  30.0 - 36.0 g/dL   RDW 40.9  81.1 - 91.4 %   Platelets 161  150 - 400 K/uL   Neutrophils Relative % 62  43 - 77 %   Neutro Abs 2.9  1.7 - 7.7 K/uL   Lymphocytes Relative 29  12 - 46 %   Lymphs Abs 1.3  0.7 - 4.0 K/uL   Monocytes Relative 7  3 - 12 %   Monocytes Absolute 0.3  0.1 - 1.0 K/uL   Eosinophils Relative 2  0 - 5 %   Eosinophils Absolute 0.1  0.0 - 0.7 K/uL   Basophils Relative 1  0 - 1 %   Basophils Absolute 0.0  0.0 - 0.1 K/uL  BASIC METABOLIC PANEL      Result Value Ref Range   Sodium 138  137 - 147 mEq/L   Potassium 4.6  3.7 - 5.3 mEq/L   Chloride 100  96 - 112 mEq/L   CO2 25  19 - 32 mEq/L   Glucose, Bld 97  70 - 99 mg/dL   BUN 16  6 - 23 mg/dL   Creatinine, Ser 7.82 (*) 0.50 - 1.35 mg/dL   Calcium 9.7  8.4 - 95.6 mg/dL   GFR calc non Af Amer 62 (*) >90 mL/min   GFR calc Af Amer 72 (*) >90 mL/min   Anion gap 13  5 - 15  I-STAT TROPOININ, ED      Result Value Ref Range   Troponin i, poc 0.00  0.00 - 0.08 ng/mL   Comment 3           I-STAT TROPOININ, ED      Result Value Ref Range   Troponin i, poc 0.00  0.00 - 0.08 ng/mL   Comment 3            Dg Chest 2 View  11/20/2013   CLINICAL DATA:  Finger and wrist swelling following be sustain  EXAM: CHEST  2 VIEW  COMPARISON:  PA  and  lateral chest x-ray of September 30, 2012  FINDINGS: The lungs are adequately inflated and clear. The heart and pulmonary vascularity are normal. There is no pleural effusion. The mediastinum is normal in width. The bony thorax is unremarkable.  IMPRESSION: No active cardiopulmonary disease.   Electronically Signed   By: David  [place]   On: 11/20/2013 15:51      EKG Interpretation   Date/Time:  Friday November 20 2013 17:31:21 EDT Ventricular Rate:  65 PR Interval:  182 QRS Duration: 92 QT Interval:  396 QTC Calculation: 411 R Axis:   59 Text Interpretation:  Normal sinus rhythm Nonspecific ST and T wave  abnormality Abnormal ECG Confirmed by WARD,  DO, KRISTEN ([id number]) on  11/20/2013 5:50:34 PM      MDM   Final diagnoses:  Insect bite  Chest pain, unspecified chest pain type    Patient with original complaint of bee sting.  Will treat symptomatically.  No evidence of anaphylactic reaction.  Developed chest pain in the ED.  Will further evaluate this with labs, ekg, and cxr.  Will give aspirin.  Patient discussed with Dr. Elesa Massed.  Will get delta troponin and EKG to ensure that there are no developing symptoms.  4:30 PM Patient states that his chest is feeling fine now.  He has no hx.  Doubt cardiac process.  HEART score is 1.  6:00 PM Delta troponin and those that EKG are negative. Discharged to home. The patient pain free. Left hand improved with Benadryl. Recommend continuing this for the next couple of days.    Roxy Horseman, PA-C 11/20/13 1800

## 2013-11-20 NOTE — ED Notes (Addendum)
Pt reports stung by bee around 1300 yesterday. Pt presents with redness and swelling extending for two of fingers to wrist. Pt denies CP. Pt in NAD.

## 2013-11-21 ENCOUNTER — Emergency Department (HOSPITAL_COMMUNITY): Payer: Medicare Other

## 2013-11-21 ENCOUNTER — Emergency Department (HOSPITAL_COMMUNITY)
Admission: EM | Admit: 2013-11-21 | Discharge: 2013-11-21 | Disposition: A | Discharge status: Home / Self Care | Payer: Medicare Other | Attending: Emergency Medicine | Admitting: Emergency Medicine

## 2013-11-21 ENCOUNTER — Encounter (HOSPITAL_COMMUNITY): Payer: Self-pay | Admitting: Emergency Medicine

## 2013-11-21 DIAGNOSIS — Z8701 Personal history of pneumonia (recurrent): Secondary | ICD-10-CM | POA: Diagnosis not present

## 2013-11-21 DIAGNOSIS — R110 Nausea: Secondary | ICD-10-CM | POA: Insufficient documentation

## 2013-11-21 DIAGNOSIS — I10 Essential (primary) hypertension: Secondary | ICD-10-CM | POA: Insufficient documentation

## 2013-11-21 DIAGNOSIS — Z87891 Personal history of nicotine dependence: Secondary | ICD-10-CM | POA: Insufficient documentation

## 2013-11-21 DIAGNOSIS — R42 Dizziness and giddiness: Secondary | ICD-10-CM | POA: Insufficient documentation

## 2013-11-21 DIAGNOSIS — Z79899 Other long term (current) drug therapy: Secondary | ICD-10-CM | POA: Insufficient documentation

## 2013-11-21 DIAGNOSIS — Z87828 Personal history of other (healed) physical injury and trauma: Secondary | ICD-10-CM | POA: Diagnosis not present

## 2013-11-21 DIAGNOSIS — H5440 Blindness, one eye, unspecified eye: Secondary | ICD-10-CM | POA: Diagnosis not present

## 2013-11-21 DIAGNOSIS — T6391XA Toxic effect of contact with unspecified venomous animal, accidental (unintentional), initial encounter: Secondary | ICD-10-CM | POA: Diagnosis not present

## 2013-11-21 DIAGNOSIS — T7840XA Allergy, unspecified, initial encounter: Secondary | ICD-10-CM

## 2013-11-21 DIAGNOSIS — R0602 Shortness of breath: Secondary | ICD-10-CM | POA: Insufficient documentation

## 2013-11-21 LAB — COMPREHENSIVE METABOLIC PANEL
ALK PHOS: 62 U/L (ref 39–117)
ALT: 9 U/L (ref 0–53)
ANION GAP: 13 (ref 5–15)
AST: 19 U/L (ref 0–37)
Albumin: 3.9 g/dL (ref 3.5–5.2)
BUN: 17 mg/dL (ref 6–23)
CO2: 23 meq/L (ref 19–32)
Calcium: 9.5 mg/dL (ref 8.4–10.5)
Chloride: 104 mEq/L (ref 96–112)
Creatinine, Ser: 1.37 mg/dL — ABNORMAL HIGH (ref 0.50–1.35)
GFR calc Af Amer: 72 mL/min — ABNORMAL LOW (ref >90)
GFR, EST NON AFRICAN AMERICAN: 62 mL/min — AB (ref >90)
GLUCOSE: 105 mg/dL — AB (ref 70–99)
POTASSIUM: 4.3 meq/L (ref 3.7–5.3)
SODIUM: 140 meq/L (ref 137–147)
TOTAL PROTEIN: 7.2 g/dL (ref 6.0–8.3)
Total Bilirubin: <0.2 mg/dL — ABNORMAL LOW (ref 0.3–1.2)

## 2013-11-21 LAB — CBC
HCT: 42.1 % (ref 39.0–52.0)
Hemoglobin: 13.8 g/dL (ref 13.0–17.0)
MCH: 25.2 pg — ABNORMAL LOW (ref 26.0–34.0)
MCHC: 32.8 g/dL (ref 30.0–36.0)
MCV: 76.8 fL — ABNORMAL LOW (ref 78.0–100.0)
Platelets: 217 K/uL (ref 150–400)
RBC: 5.48 MIL/uL (ref 4.22–5.81)
RDW: 15 % (ref 11.5–15.5)
WBC: 4 K/uL (ref 4.0–10.5)

## 2013-11-21 LAB — DIFFERENTIAL
Basophils Absolute: 0 10*3/uL (ref 0.0–0.1)
Basophils Relative: 0 % (ref 0–1)
EOS ABS: 0.2 10*3/uL (ref 0.0–0.7)
Eosinophils Relative: 4 % (ref 0–5)
Lymphocytes Relative: 26 % (ref 12–46)
Lymphs Abs: 1 10*3/uL (ref 0.7–4.0)
Monocytes Absolute: 0.3 10*3/uL (ref 0.1–1.0)
Monocytes Relative: 8 % (ref 3–12)
NEUTROS ABS: 2.5 10*3/uL (ref 1.7–7.7)
Neutrophils Relative %: 62 % (ref 43–77)

## 2013-11-21 LAB — URINALYSIS, ROUTINE W REFLEX MICROSCOPIC
BILIRUBIN URINE: NEGATIVE
Glucose, UA: NEGATIVE mg/dL
HGB URINE DIPSTICK: NEGATIVE
Ketones, ur: NEGATIVE mg/dL
Leukocytes, UA: NEGATIVE
Nitrite: NEGATIVE
PH: 5 (ref 5.0–8.0)
Protein, ur: NEGATIVE mg/dL
Specific Gravity, Urine: 1.025 (ref 1.005–1.030)
UROBILINOGEN UA: 0.2 mg/dL (ref 0.0–1.0)

## 2013-11-21 LAB — RAPID URINE DRUG SCREEN, HOSP PERFORMED
Amphetamines: NOT DETECTED
Barbiturates: NOT DETECTED
Benzodiazepines: NOT DETECTED
Cocaine: NOT DETECTED
Opiates: NOT DETECTED
Tetrahydrocannabinol: NOT DETECTED

## 2013-11-21 LAB — PROTIME-INR
INR: 1 (ref 0.00–1.49)
PROTHROMBIN TIME: 13.2 s (ref 11.6–15.2)

## 2013-11-21 LAB — APTT: aPTT: 30 seconds (ref 24–37)

## 2013-11-21 LAB — I-STAT TROPONIN, ED: Troponin i, poc: 0 ng/mL (ref 0.00–0.08)

## 2013-11-21 LAB — ETHANOL

## 2013-11-21 MED ORDER — LORAZEPAM 2 MG/ML IJ SOLN
2.0000 mg | Freq: Once | INTRAMUSCULAR | Status: DC
  Filled 2013-11-21: qty 1

## 2013-11-21 MED ORDER — FAMOTIDINE 40 MG PO TABS: 20.0000 mg | ORAL_TABLET | Freq: Two times a day (BID) | ORAL | Status: DC

## 2013-11-21 NOTE — ED Notes (Signed)
Pt report SOB and dizziness starting an hour ago. Pt denies CP.

## 2013-11-21 NOTE — ED Notes (Signed)
Pt from home c/o sudden onset SOB that started approx 1 hr PTA. Pt was seen at this facility yesterday for a bee sting that he received 3 days ago with hand swelling. Pt reports that he has been taking benadryl at home. Pt is A&O and in NAD

## 2013-11-21 NOTE — ED Provider Notes (Signed)
CSN: [id number]     Arrival date & time 11/21/13  1044 History   First MD Initiated Contact with Patient 11/21/13 1047     Chief Complaint  Patient presents with  . Shortness of Breath     (Consider location/radiation/quality/duration/timing/severity/associated sxs/prior Treatment) HPI Comments: Patient is a 43 year old male with history of detached retina who presents the emergency department with sudden onset dizziness. He reports that this began one hour prior to arrival. It is associated with shortness of breath. Patient is a poor historian and repeats "I'm dizzy" frequently. He was stung by a be on Wednesday and was evaluated in the emergency department yesterday for left hand swelling. He was given benadryl and sent home. He was initially feeling well, but acutely worsened this morning. He has known allergies to erythromycin.   The history is provided by the patient. No language interpreter was used.    Past Medical History  Diagnosis Date  . Blind one eye     right   . Detached retina   . Pneumonia   . Knee injury    Past Surgical History  Procedure Laterality Date  . Eye surgery     No family history on file. History  Substance Use Topics  . Smoking status: Former Smoker    Types: Cigars    Quit date: 02/28/2011  . Smokeless tobacco: Never Used  . Alcohol Use: 0.0 oz/week     Comment: socially    Review of Systems  Constitutional: Negative for fever, chills and diaphoresis.  Respiratory: Positive for shortness of breath.   Cardiovascular: Negative for chest pain.  Gastrointestinal: Positive for nausea. Negative for vomiting and abdominal pain.  Neurological: Positive for dizziness.  All other systems reviewed and are negative.     Allergies  Erythromycin and Other  Home Medications   Prior to Admission medications   Medication Sig Start Date End Date Taking? Authorizing Provider  diphenhydrAMINE (BENADRYL) 25 MG tablet Take 1 tablet (25 mg total) by  mouth every 6 (six) hours as needed for itching (Rash). 11/20/13   Roxy Horseman, PA-C  ibuprofen (ADVIL,MOTRIN) 200 MG tablet Take 200 mg by mouth every 6 (six) hours as needed for mild pain.    Historical Provider, MD  neomycin-bacitracin-polymyxin (NEOSPORIN) ointment Apply 1 application topically once as needed for wound care (bite). apply to eye    Historical Provider, MD  omeprazole (PRILOSEC) 20 MG capsule Take 40 mg by mouth daily. 06/15/11 09/30/12  Gwenlyn Found Copland, MD   BP 158/86  Pulse 68  Temp(Src) 98.2 F (36.8 C) (Oral)  Resp 16  SpO2 98% Physical Exam  Nursing note and vitals reviewed. Constitutional: He is oriented to person, place, and time. He appears well-developed and well-nourished. No distress.  HENT:  Head: Normocephalic and atraumatic.  Right Ear: External ear normal.  Left Ear: External ear normal.  Nose: Nose normal.  Eyes: Conjunctivae and EOM are normal. Pupils are equal, round, and reactive to light.  Neck: Normal range of motion. No tracheal deviation present.  Cardiovascular: Normal rate, regular rhythm, normal heart sounds, intact distal pulses and normal pulses.   Pulses:      Radial pulses are 2+ on the right side, and 2+ on the left side.       Posterior tibial pulses are 2+ on the right side, and 2+ on the left side.  Pulmonary/Chest: Effort normal and breath sounds normal. No stridor.  Abdominal: Soft. He exhibits no distension. There is no tenderness.  Musculoskeletal: Normal range of motion.  Moves all extremities without guarding or ataxia.   Neurological: He is alert and oriented to person, place, and time. He has normal strength. Coordination and gait normal. GCS eye subscore is 4. GCS verbal subscore is 5. GCS motor subscore is 6.  Finger nose finger normal, rapid alternating movements normal, heel knee shin normal. Grip strength 5/5 bilaterally Strength 5/5 in all extremities.   Skin: Skin is warm and dry. He is not diaphoretic.  Localized  swelling and erythema to left hand. No retained stinger.   Psychiatric: He has a normal mood and affect. His behavior is normal.    ED Course  Procedures (including critical care time) Labs Review Labs Reviewed  CBC - Abnormal; Notable for the following:    MCV 76.8 (*)    MCH 25.2 (*)    All other components within normal limits  COMPREHENSIVE METABOLIC PANEL - Abnormal; Notable for the following:    Glucose, Bld 105 (*)    Creatinine, Ser 1.37 (*)    Total Bilirubin <0.2 (*)    GFR calc non Af Amer 62 (*)    GFR calc Af Amer 72 (*)    All other components within normal limits  I-STAT CHEM 8, ED - Abnormal; Notable for the following:    Sodium 133 (*)    Potassium 8.9 (*)    BUN 25 (*)    Creatinine, Ser 1.60 (*)    Glucose, Bld 117 (*)    Calcium, Ion 0.97 (*)    All other components within normal limits  DIFFERENTIAL  URINE RAPID DRUG SCREEN (HOSP PERFORMED)  URINALYSIS, ROUTINE W REFLEX MICROSCOPIC  ETHANOL  PROTIME-INR  APTT  I-STAT TROPOININ, ED    Imaging Review Dg Chest 2 View  11/20/2013   CLINICAL DATA:  Finger and wrist swelling following be sustain  EXAM: CHEST  2 VIEW  COMPARISON:  PA and lateral chest x-ray of September 30, 2012  FINDINGS: The lungs are adequately inflated and clear. The heart and pulmonary vascularity are normal. There is no pleural effusion. The mediastinum is normal in width. The bony thorax is unremarkable.  IMPRESSION: No active cardiopulmonary disease.   Electronically Signed   By: David  [place]   On: 11/20/2013 15:51   Ct Head Wo Contrast  11/21/2013   CLINICAL DATA:  Shortness of breath.  Recent bee sting.  EXAM: CT HEAD WITHOUT CONTRAST  TECHNIQUE: Contiguous axial images were obtained from the base of the skull through the vertex without intravenous contrast.  COMPARISON:  No priors.  FINDINGS: No acute intracranial abnormalities. Specifically, no evidence of acute intracranial hemorrhage, no definite findings of acute/subacute cerebral  ischemia, no mass, mass effect, hydrocephalus or abnormal intra or extra-axial fluid collections. Visualized paranasal sinuses and mastoids are well pneumatized. No acute displaced skull fractures are identified. Bilateral scleral bands are noted. High attenuation within the right globe, presumably injected material for prior history of retinal detachment.  IMPRESSION: *No acute intracranial abnormalities. *The appearance of the brain is normal.   Electronically Signed   By: Trudie Reed M.D.   On: 11/21/2013 11:41   Mr Brain Wo Contrast  11/21/2013   CLINICAL DATA:  Dizziness. Evaluate for stroke. Stroke risk factors include hypertension.  EXAM: MRI HEAD WITHOUT CONTRAST  TECHNIQUE: Multiplanar, multiecho pulse sequences of the brain and surrounding structures were obtained without intravenous contrast.  COMPARISON:  CT head earlier in the day.  FINDINGS: No evidence for acute infarction, hemorrhage, mass  lesion, hydrocephalus, or extra-axial fluid. Normal cerebral volume. No white matter disease. Flow voids are maintained throughout the carotid, basilar, and vertebral arteries. There are no areas of chronic hemorrhage. Normal pituitary and cerebellar tonsils. Cervical spondylosis. Detached retina with vitreous hemorrhage on the RIGHT, prior ocular surgery on the LEFT. No acute sinus or mastoid disease.  IMPRESSION: No acute intracranial findings. Chronic changes as described. Good agreement with prior CT.   Electronically Signed   By: Davonna Belling M.D.   On: 11/21/2013 15:26     EKG Interpretation   Date/Time:  Saturday November 21 2013 11:15:37 EDT Ventricular Rate:  73 PR Interval:  174 QRS Duration: 75 QT Interval:  349 QTC Calculation: 384 R Axis:   73 Text Interpretation:  Sinus rhythm Borderline T wave abnormalities lead 3  t wave inversion no new findings Confirmed by Rhunette Croft, MD, ANKIT [phone])  on 11/21/2013 11:22:24 AM      MDM   Final diagnoses:  Dizziness  Allergic reaction,  initial encounter    11:09 AM Discussed case with Dr. Thad Ranger. Will get MR if CT normal. Patient can be treated as more of peripheral vertigo if MRI is negative.   Patient feels significantly improved. MRI negative for acute pathology. Labs are unremarkable. Patient discharged home with symptomatic therapy. Discussed reasons to return to the ED immediately. Vital signs stable for discharge. Dr. Rhunette Croft evaluated patient and agrees with plan. Patient / Family / Caregiver informed of clinical course, understand medical decision-making process, and agree with plan.   Mora Bellman, PA-C 11/22/13 1013

## 2013-11-21 NOTE — ED Notes (Signed)
Per MRI pt shaking and nervous. Upon plan to administer ativan to calm pt, pt continue to receive MRI without complication. Ativan not administered.

## 2013-11-21 NOTE — Discharge Instructions (Signed)
Dizziness  Dizziness means you feel unsteady or lightheaded. You might feel like you are going to pass out (faint). HOME CARE   Drink enough fluids to keep your pee (urine) clear or pale yellow.  Take your medicines exactly as told by your doctor. If you take blood pressure medicine, always stand up slowly from the lying or sitting position. Hold on to something to steady yourself.  If you need to stand in one place for a long time, move your legs often. Tighten and relax your leg muscles.  Have someone stay with you until you feel okay.  Do not drive or use heavy machinery if you feel dizzy.  Do not drink alcohol. GET HELP RIGHT AWAY IF:   You feel dizzy or lightheaded and it gets worse.  You feel sick to your stomach (nauseous), or you throw up (vomit).  You have trouble talking or walking.  You feel weak or have trouble using your arms, hands, or legs.  You cannot think clearly or have trouble forming sentences.  You have chest pain, belly (abdominal) pain, sweating, or you are short of breath.  Your vision changes.  You are bleeding.  You have problems from your medicine that seem to be getting worse. MAKE SURE YOU:   Understand these instructions.  Will watch your condition.  Will get help right away if you are not doing well or get worse. Document Released: 03/01/2011 Document Revised: 06/04/2011 Document Reviewed: 03/01/2011 [facility] Patient Information 2015 [place], [place]. This information is not intended to replace advice given to you by your health care provider. Make sure you discuss any questions you have with your health care provider.  Allergies  Allergies may happen from anything your body is sensitive to. This may be food, medicines, pollens, chemicals, and many other things. Food allergies can be severe and deadly.  HOME CARE  If you do not know what causes a reaction, keep a diary. Write down the foods you ate and the symptoms that followed. Avoid  foods that cause reactions.  If you have red raised spots (hives) or a rash:  Take medicine as told by your doctor.  Use medicines for red raised spots and itching as needed.  Apply cold cloths (compresses) to the skin. Take a cool bath. Avoid hot baths or showers.  If you are severely allergic:  It is often necessary to go to the hospital after you have treated your reaction.  Wear your medical alert jewelry.  You and your family must learn how to give a allergy shot or use an allergy kit (anaphylaxis kit).  Always carry your allergy kit or shot with you. Use this medicine as told by your doctor if a severe reaction is occurring. GET HELP RIGHT AWAY IF:  You have trouble breathing or are making high-pitched whistling sounds (wheezing).  You have a tight feeling in your chest or throat.  You have a puffy (swollen) mouth.  You have red raised spots, puffiness (swelling), or itching all over your body.  You have had a severe reaction that was helped by your allergy kit or shot. The reaction can return once the medicine has worn off.  You think you are having a food allergy. Symptoms most often happen within 30 minutes of eating a food.  Your symptoms have not gone away within 2 days or are getting worse.  You have new symptoms.  You want to retest yourself with a food or drink you think causes an allergic reaction.  Only do this under the care of a doctor. MAKE SURE YOU:   Understand these instructions.  Will watch your condition.  Will get help right away if you are not doing well or get worse. Document Released: 07/07/2012 Document Reviewed: 07/07/2012 [facility] Patient Information 2015 [place]. This information is not intended to replace advice given to you by your health care provider. Make sure you discuss any questions you have with your health care provider.

## 2013-11-21 NOTE — ED Notes (Signed)
Pt transported to MRI 

## 2013-11-22 NOTE — ED Provider Notes (Signed)
Medical screening examination/treatment/procedure(s) were conducted as a shared visit with non-physician practitioner(s) and myself.  I personally evaluated the patient during the encounter.   EKG Interpretation   Date/Time:  Saturday November 21 2013 11:15:37 EDT Ventricular Rate:  73 PR Interval:  174 QRS Duration: 75 QT Interval:  349 QTC Calculation: 384 R Axis:   73 Text Interpretation:  Sinus rhythm Borderline T wave abnormalities lead 3  t wave inversion no new findings Confirmed by Lasundra Hascall, MD, Lashawn Orrego ([id number])  on 11/21/2013 11:22:24 AM      Pt with cc of dizziness, constant. Pt has normal cerebellar exam. Neuro exam normal, but because of the constant dizziness, MRI ordered, no stroke seen. Will d.c.  Derwood Kaplan, MD 11/22/13 [phone]

## 2013-11-23 LAB — I-STAT CHEM 8, ED
BUN: 25 mg/dL — ABNORMAL HIGH (ref 6–23)
Calcium, Ion: 0.97 mmol/L — ABNORMAL LOW (ref 1.12–1.23)
Chloride: 110 mEq/L (ref 96–112)
Creatinine, Ser: 1.6 mg/dL — ABNORMAL HIGH (ref 0.50–1.35)
GLUCOSE: 117 mg/dL — AB (ref 70–99)
HEMATOCRIT: 46 % (ref 39.0–52.0)
HEMOGLOBIN: 15.6 g/dL (ref 13.0–17.0)
POTASSIUM: 8.9 meq/L — AB (ref 3.7–5.3)
Sodium: 133 mEq/L — ABNORMAL LOW (ref 137–147)
TCO2: 22 mmol/L (ref 0–100)

## 2014-06-23 ENCOUNTER — Ambulatory Visit (INDEPENDENT_AMBULATORY_CARE_PROVIDER_SITE_OTHER): Payer: Medicare Other | Admitting: Family Medicine

## 2014-06-23 VITALS — BP 132/80 | HR 93 | Temp 98.0°F | Resp 16 | Ht 75.0 in | Wt 266.4 lb

## 2014-06-23 DIAGNOSIS — R1032 Left lower quadrant pain: Secondary | ICD-10-CM

## 2014-06-23 DIAGNOSIS — R059 Cough, unspecified: Secondary | ICD-10-CM

## 2014-06-23 DIAGNOSIS — Z720 Tobacco use: Secondary | ICD-10-CM

## 2014-06-23 DIAGNOSIS — R05 Cough: Secondary | ICD-10-CM | POA: Diagnosis not present

## 2014-06-23 NOTE — Progress Notes (Addendum)
Subjective:  This chart was scribed for Richard Staggers, MD by Richard Bautista, Medial Scribe. This patient was seen in room 11 and the patient's care was started at 2:06 PM.    Patient ID: Richard Bautista, male    DOB: 02/22/1971, 44 y.o.   MRN: [id number] Chief Complaint  Patient presents with  . Abdominal Pain    x 2 weeks    HPI HPI Comments: Richard Bautista is a 44 y.o. male who presents to the [facility] complaining of inguinal pain for 3-4 weeks now; patient suspects he has a hernia. Patient reports left groin pain with coughing and straining; patient denies any additional pain. Patient attributes onset of pain to heavy lifting, but he is unable to exactly recall his activity at the time. Patient denies presence of bulging, but reports development of pain after the initial incident. Patient denies urinary symptoms including penile discharge, dysuria, hematuria, or testicular pain. Patient denies history of STDs.  Patient is a smoker and states he is considering smoking. Patient also PMHx of  GERD; he is no longer on Pepcid. Patient states that he is no longer experiencing heartburn. Patient with history of dry cough with no associated fever, diarrhea, blood in stool, nausea or vomiting.    Patient Active Problem List   Diagnosis Date Noted  . Pain of right great toe 09/09/2013  . HYPERTRIGLYCERIDEMIA 05/06/2008  . COUGH 04/19/2008  . OBESITY 02/13/2008  . TOBACCO ABUSE 02/13/2008  . HYPERTENSION, BENIGN ESSENTIAL 02/13/2008  . GERD 02/13/2008  . RETINAL DETACHMENT, RIGHT EYE, HX OF 08/25/2007   Past Medical History  Diagnosis Date  . Blind one eye     right   . Detached retina   . Pneumonia   . Knee injury    Past Surgical History  Procedure Laterality Date  . Eye surgery     Allergies  Allergen Reactions  . Erythromycin     REACTION: faint, chills  . Other Rash    "All metal. Jewelry."     Prior to Admission medications   Medication Sig Start  Date End Date Taking? Authorizing Provider  ibuprofen (ADVIL,MOTRIN) 200 MG tablet Take 200 mg by mouth every 6 (six) hours as needed for mild pain.   Yes Historical Provider, MD  diphenhydrAMINE (BENADRYL) 25 MG tablet Take 1 tablet (25 mg total) by mouth every 6 (six) hours as needed for itching (Rash). Patient not taking: Reported on 06/23/2014 11/20/13   Roxy Horseman, PA-C  famotidine (PEPCID) 40 MG tablet Take 0.5 tablets (20 mg total) by mouth 2 (two) times daily. OTC Patient not taking: Reported on 06/23/2014 11/21/13   Junious Silk, PA-C  neomycin-bacitracin-polymyxin (NEOSPORIN) ointment Apply 1 application topically once as needed for wound care (bite). apply to eye    Historical Provider, MD   History   Social History  . Marital Status: Single    Spouse Name: N/A  . Number of Children: N/A  . Years of Education: N/A   Occupational History  . Not on file.   Social History Main Topics  . Smoking status: Former Smoker    Types: Cigars    Quit date: 02/28/2011  . Smokeless tobacco: Never Used  . Alcohol Use: 0.0 oz/week     Comment: socially  . Drug Use: No  . Sexual Activity: Not on file   Other Topics Concern  . Not on file   Social History Narrative      Review of Systems  Constitutional: Negative for fever and chills.  Respiratory: Positive for cough.   Cardiovascular: Negative for chest pain.  Gastrointestinal: Negative for nausea, vomiting, abdominal pain, diarrhea and blood in stool.  Genitourinary: Negative for dysuria, hematuria and testicular pain.  Skin: Negative for rash.       Objective:   Physical Exam  Constitutional: He is oriented to person, place, and time. He appears well-developed and well-nourished. No distress.  HENT:  Head: Normocephalic and atraumatic.  Eyes: EOM are normal.  Neck: Neck supple. No tracheal deviation present.  Cardiovascular: Normal rate.   Pulmonary/Chest: Effort normal. No respiratory distress.  Abdominal: Soft.  There is no tenderness.  Genitourinary:  No appreciable hernia on lower abdominal wall or swelling but with palpation of inguinal ring on left with coughing possible hernia appreciated. This was in the similar area of his discomfort. Testicles nontender, no penile discharge or rash.   Musculoskeletal: Normal range of motion.  Neurological: He is alert and oriented to person, place, and time.  Skin: Skin is warm and dry.  Psychiatric: He has a normal mood and affect. His behavior is normal.  Nursing note and vitals reviewed.   Filed Vitals:   06/23/14 1342  BP: 132/80  Pulse: 93  Temp: 98 F (36.7 C)  TempSrc: Oral  Resp: 16  Height:  (1.905 m)  Weight: 266 lb 6.4 oz (120.838 kg)  SpO2: 96%         Assessment & Plan:  Richard Bautista is a 44 y.o. male Groin pain, left , abdominal wall pain in left lower quadrant - Plan: Ambulatory referral to General Surgery  - possible abd wall pain with coughing, but may have palpable hernia on L canal with testing. Refer to general surgery for repeat exam.   Cough, Tobacco abuse  - LPR vs cough with smoking vs. AR. Restart pepcid or zantac,  [facility] smoking cessation classes and if would want Chantix - return to discuss precautions with this medicine.     No orders of the defined types were placed in this encounter.   Patient Instructions  Try getting back on pepcid or zantac for your cough. [place] offers smoking cessation clinics. Registration is required. To register call [phone] or register online at [email or web address].  You may have a hernia on the left side, or could be a strain of your abdominal wall muscles with coughing. I will refer you to a surgeon for another exam of this area as possible hernia is present. Return to the clinic or go to the nearest emergency room if any of your symptoms worsen or new symptoms occur.  Hernia A hernia occurs when an internal organ pushes out through a weak spot in the abdominal wall.  Hernias most commonly occur in the groin and around the navel. Hernias often can be pushed back into place (reduced). Most hernias tend to get worse over time. Some abdominal hernias can get stuck in the opening (irreducible or incarcerated hernia) and cannot be reduced. An irreducible abdominal hernia which is tightly squeezed into the opening is at risk for impaired blood supply (strangulated hernia). A strangulated hernia is a medical emergency. Because of the risk for an irreducible or strangulated hernia, surgery may be recommended to repair a hernia. CAUSES   Heavy lifting.  Prolonged coughing.  Straining to have a bowel movement.  A cut (incision) made during an abdominal surgery. HOME CARE INSTRUCTIONS   Bed rest is not required. You may continue your normal activities.  Avoid lifting more than 10 pounds (4.5 kg) or straining.  Cough gently. If you are a smoker it is best to stop. Even the best hernia repair can break down with the continual strain of coughing. Even if you do not have your hernia repaired, a cough will continue to aggravate the problem.  Do not wear anything tight over your hernia. Do not try to keep it in with an outside bandage or truss. These can damage abdominal contents if they are trapped within the hernia sac.  Eat a normal diet.  Avoid constipation. Straining over long periods of time will increase hernia size and encourage breakdown of repairs. If you cannot do this with diet alone, stool softeners may be used. SEEK IMMEDIATE MEDICAL CARE IF:   You have a fever.  You develop increasing abdominal pain.  You feel nauseous or vomit.  Your hernia is stuck outside the abdomen, looks discolored, feels hard, or is tender.  You have any changes in your bowel habits or in the hernia that are unusual for you.  You have increased pain or swelling around the hernia.  You cannot push the hernia back in place by applying gentle pressure while lying down. MAKE  SURE YOU:   Understand these instructions.  Will watch your condition.  Will get help right away if you are not doing well or get worse. Document Released: 03/12/2005 Document Revised: 06/04/2011 Document Reviewed: 10/30/2007 [facility] Patient Information 2015 [place], [place]. This information is not intended to replace advice given to you by your health care provider. Make sure you discuss any questions you have with your health care provider.   Cough, Adult  A cough is a reflex that helps clear your throat and airways. It can help heal the body or may be a reaction to an irritated airway. A cough may only last 2 or 3 weeks (acute) or may last more than 8 weeks (chronic).  CAUSES Acute cough:  Viral or bacterial infections. Chronic cough:  Infections.  Allergies.  Asthma.  Post-nasal drip.  Smoking.  Heartburn or acid reflux.  Some medicines.  Chronic lung problems (COPD).  Cancer. SYMPTOMS   Cough.  Fever.  Chest pain.  Increased breathing rate.  High-pitched whistling sound when breathing (wheezing).  Colored mucus that you cough up (sputum). TREATMENT   A bacterial cough may be treated with antibiotic medicine.  A viral cough must run its course and will not respond to antibiotics.  Your caregiver may recommend other treatments if you have a chronic cough. HOME CARE INSTRUCTIONS   Only take over-the-counter or prescription medicines for pain, discomfort, or fever as directed by your caregiver. Use cough suppressants only as directed by your caregiver.  Use a cold steam vaporizer or humidifier in your bedroom or home to help loosen secretions.  Sleep in a semi-upright position if your cough is worse at night.  Rest as needed.  Stop smoking if you smoke. SEEK IMMEDIATE MEDICAL CARE IF:   You have pus in your sputum.  Your cough starts to worsen.  You cannot control your cough with suppressants and are losing sleep.  You begin coughing up  blood.  You have difficulty breathing.  You develop pain which is getting worse or is uncontrolled with medicine.  You have a fever. MAKE SURE YOU:   Understand these instructions.  Will watch your condition.  Will get help right away if you are not doing well or get worse. Document Released: 09/08/2010 Document Revised: 06/04/2011 Document  Reviewed: 09/08/2010 [employer] Patient Information 2015 [place], [place]. This information is not intended to replace advice given to you by your health care provider. Make sure you discuss any questions you have with your health care provider.      I personally performed the services described in this documentation, which was scribed in my presence. The recorded information has been reviewed and considered, and addended by me as needed.

## 2014-06-23 NOTE — Patient Instructions (Signed)
Try getting back on pepcid or zantac for your cough. [place] offers smoking cessation clinics. Registration is required. To register call [phone] or register online at [email or web address].  You may have a hernia on the left side, or could be a strain of your abdominal wall muscles with coughing. I will refer you to a surgeon for another exam of this area as possible hernia is present. Return to the clinic or go to the nearest emergency room if any of your symptoms worsen or new symptoms occur.  Hernia A hernia occurs when an internal organ pushes out through a weak spot in the abdominal wall. Hernias most commonly occur in the groin and around the navel. Hernias often can be pushed back into place (reduced). Most hernias tend to get worse over time. Some abdominal hernias can get stuck in the opening (irreducible or incarcerated hernia) and cannot be reduced. An irreducible abdominal hernia which is tightly squeezed into the opening is at risk for impaired blood supply (strangulated hernia). A strangulated hernia is a medical emergency. Because of the risk for an irreducible or strangulated hernia, surgery may be recommended to repair a hernia. CAUSES   Heavy lifting.  Prolonged coughing.  Straining to have a bowel movement.  A cut (incision) made during an abdominal surgery. HOME CARE INSTRUCTIONS   Bed rest is not required. You may continue your normal activities.  Avoid lifting more than 10 pounds (4.5 kg) or straining.  Cough gently. If you are a smoker it is best to stop. Even the best hernia repair can break down with the continual strain of coughing. Even if you do not have your hernia repaired, a cough will continue to aggravate the problem.  Do not wear anything tight over your hernia. Do not try to keep it in with an outside bandage or truss. These can damage abdominal contents if they are trapped within the hernia sac.  Eat a normal diet.  Avoid constipation. Straining over  long periods of time will increase hernia size and encourage breakdown of repairs. If you cannot do this with diet alone, stool softeners may be used. SEEK IMMEDIATE MEDICAL CARE IF:   You have a fever.  You develop increasing abdominal pain.  You feel nauseous or vomit.  Your hernia is stuck outside the abdomen, looks discolored, feels hard, or is tender.  You have any changes in your bowel habits or in the hernia that are unusual for you.  You have increased pain or swelling around the hernia.  You cannot push the hernia back in place by applying gentle pressure while lying down. MAKE SURE YOU:   Understand these instructions.  Will watch your condition.  Will get help right away if you are not doing well or get worse. Document Released: 03/12/2005 Document Revised: 06/04/2011 Document Reviewed: 10/30/2007 [facility] Patient Information 2015 [place], [place]. This information is not intended to replace advice given to you by your health care provider. Make sure you discuss any questions you have with your health care provider.   Cough, Adult  A cough is a reflex that helps clear your throat and airways. It can help heal the body or may be a reaction to an irritated airway. A cough may only last 2 or 3 weeks (acute) or may last more than 8 weeks (chronic).  CAUSES Acute cough:  Viral or bacterial infections. Chronic cough:  Infections.  Allergies.  Asthma.  Post-nasal drip.  Smoking.  Heartburn or acid reflux.  Some medicines.  Chronic lung problems (COPD).  Cancer. SYMPTOMS   Cough.  Fever.  Chest pain.  Increased breathing rate.  High-pitched whistling sound when breathing (wheezing).  Colored mucus that you cough up (sputum). TREATMENT   A bacterial cough may be treated with antibiotic medicine.  A viral cough must run its course and will not respond to antibiotics.  Your caregiver may recommend other treatments if you have a chronic  cough. HOME CARE INSTRUCTIONS   Only take over-the-counter or prescription medicines for pain, discomfort, or fever as directed by your caregiver. Use cough suppressants only as directed by your caregiver.  Use a cold steam vaporizer or humidifier in your bedroom or home to help loosen secretions.  Sleep in a semi-upright position if your cough is worse at night.  Rest as needed.  Stop smoking if you smoke. SEEK IMMEDIATE MEDICAL CARE IF:   You have pus in your sputum.  Your cough starts to worsen.  You cannot control your cough with suppressants and are losing sleep.  You begin coughing up blood.  You have difficulty breathing.  You develop pain which is getting worse or is uncontrolled with medicine.  You have a fever. MAKE SURE YOU:   Understand these instructions.  Will watch your condition.  Will get help right away if you are not doing well or get worse. Document Released: 09/08/2010 Document Revised: 06/04/2011 Document Reviewed: 09/08/2010 [facility] Patient Information 2015 [place], [place]. This information is not intended to replace advice given to you by your health care provider. Make sure you discuss any questions you have with your health care provider.

## 2014-07-19 ENCOUNTER — Other Ambulatory Visit (INDEPENDENT_AMBULATORY_CARE_PROVIDER_SITE_OTHER): Payer: Self-pay

## 2014-07-19 DIAGNOSIS — K4090 Unilateral inguinal hernia, without obstruction or gangrene, not specified as recurrent: Secondary | ICD-10-CM

## 2014-07-21 ENCOUNTER — Ambulatory Visit
Admission: RE | Admit: 2014-07-21 | Discharge: 2014-07-21 | Disposition: A | Discharge status: Home / Self Care | Payer: Medicare Other | Source: Ambulatory Visit | Attending: General Surgery | Admitting: General Surgery

## 2014-07-21 DIAGNOSIS — R102 Pelvic and perineal pain: Secondary | ICD-10-CM | POA: Diagnosis not present

## 2014-07-21 DIAGNOSIS — Z9889 Other specified postprocedural states: Secondary | ICD-10-CM | POA: Diagnosis not present

## 2014-07-21 MED ORDER — IOPAMIDOL (ISOVUE-300) INJECTION 61%
125.0000 mL | Freq: Once | INTRAVENOUS | Status: AC | PRN
  Administered 2014-07-21: 125 mL via INTRAVENOUS

## 2014-12-09 ENCOUNTER — Ambulatory Visit (INDEPENDENT_AMBULATORY_CARE_PROVIDER_SITE_OTHER): Payer: Medicare Other | Admitting: Family Medicine

## 2014-12-09 ENCOUNTER — Ambulatory Visit (INDEPENDENT_AMBULATORY_CARE_PROVIDER_SITE_OTHER): Payer: Medicare Other

## 2014-12-09 VITALS — BP 130/80 | HR 71 | Temp 98.1°F | Resp 18 | Ht 75.0 in | Wt 268.0 lb

## 2014-12-09 DIAGNOSIS — R062 Wheezing: Secondary | ICD-10-CM

## 2014-12-09 DIAGNOSIS — M791 Myalgia, unspecified site: Secondary | ICD-10-CM

## 2014-12-09 DIAGNOSIS — R05 Cough: Secondary | ICD-10-CM | POA: Diagnosis not present

## 2014-12-09 DIAGNOSIS — R059 Cough, unspecified: Secondary | ICD-10-CM

## 2014-12-09 DIAGNOSIS — R509 Fever, unspecified: Secondary | ICD-10-CM | POA: Diagnosis not present

## 2014-12-09 MED ORDER — AMOXICILLIN-POT CLAVULANATE 875-125 MG PO TABS: 1.0000 | ORAL_TABLET | Freq: Two times a day (BID) | ORAL | Status: DC

## 2014-12-09 MED ORDER — HYDROCODONE-HOMATROPINE 5-1.5 MG/5ML PO SYRP: 5.0000 mL | ORAL_SOLUTION | Freq: Three times a day (TID) | ORAL | Status: DC | PRN

## 2014-12-09 NOTE — Patient Instructions (Signed)

## 2014-12-09 NOTE — Progress Notes (Addendum)
This chart was scribed for Richard Sidle, MD by [name], medical scribe at [facility].The patient was seen in exam room 1 and the patient's care was started at 9:02 AM.  Patient ID: Richard Bautista MRN: [id number], DOB: Aug 09, 1970, 44 y.o. Date of Encounter: 12/09/2014  Primary Physician: No primary care provider on file.  Chief Complaint:  Chief Complaint  Patient presents with  . Generalized Body Aches    x 2 days  . Shortness of Breath    x 2 days  . chest congestion    x 2 days    HPI:  Richard Bautista is a 44 y.o. male who presents to [facility] complaining of shortness of breath with some chest congestion for the past 2 days. He's afraid he has pneumonia because he's had it 6 times in the past. He's been taking mucinex. He has some body aches, fever, productive cough. At night, his wife likes to sleep with the fan on and this causes him to cough at night. He smokes but not regularly.   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  Past Medical History  Diagnosis Date  . Blind one eye     right   . Detached retina   . Pneumonia   . Knee injury      Home Meds: Prior to Admission medications   Medication Sig Start Date End Date Taking? Authorizing Provider  famotidine (PEPCID) 40 MG tablet Take 0.5 tablets (20 mg total) by mouth 2 (two) times daily. OTC 11/21/13  Yes Junious Silk, PA-C  ibuprofen (ADVIL,MOTRIN) 200 MG tablet Take 200 mg by mouth every 6 (six) hours as needed for mild pain.   Yes Historical Provider, MD  neomycin-bacitracin-polymyxin (NEOSPORIN) ointment Apply 1 application topically once as needed for wound care (bite). apply to eye   Yes Historical Provider, MD    Allergies:  Allergies  Allergen Reactions  . Erythromycin     REACTION: faint, chills  . Other Rash    "All metal. Jewelry."      Social History   Social History  . Marital Status: Married    Spouse Name: N/A  . Number of  Children: N/A  . Years of Education: N/A   Occupational History  . Not on file.   Social History Main Topics  . Smoking status: Former Smoker    Types: Cigars    Quit date: 02/28/2011  . Smokeless tobacco: Never Used  . Alcohol Use: 0.0 oz/week     Comment: socially  . Drug Use: No  . Sexual Activity: Not on file   Other Topics Concern  . Not on file   Social History Narrative     Review of Systems: Constitutional: negative for chills, night sweats, weight changes; positive for fever, fatigue HEENT: negative for vision changes, hearing loss, congestion, rhinorrhea, epistaxis, or sinus pressure; positive for sore throat  Cardiovascular: negative for chest pain or palpitations Respiratory: negative for hemoptysis; positive for productive cough, chest congestion; no history of asthma, or shortness of breath Abdominal: negative for abdominal pain, nausea, vomiting, diarrhea, or constipation Dermatological: negative for rash Neurologic: negative for headache, dizziness, or syncope Musc: positive for myalgia All other systems reviewed and are otherwise negative with the exception to those above and in the HPI.   Physical Exam:  This man is extremely physically fit Blood pressure 130/80, pulse 71, temperature 98.1 F (36.7 C), temperature source Oral, resp. rate  18, height  (1.905 m), weight 268 lb (121.564 kg), SpO2 98 %., Body mass index is 33.5 kg/(m^2). General: Well developed, well nourished, in no acute distress. Head: Normocephalic, atraumatic, eyes without discharge, sclera non-icteric, nares are without discharge. Bilateral auditory canals clear, TM's are without perforation, pearly grey and translucent with reflective cone of light bilaterally. Oral cavity moist, posterior pharynx without exudate, erythema, peritonsillar abscess, or post nasal drip.  Neck: Supple. No thyromegaly. Full ROM. No lymphadenopathy. Lungs: Clear bilaterally to auscultation rales, or rhonchi.  Breathing is unlabored. Some expiratory wheezing Heart: RRR with S1 S2. No murmurs, rubs, or gallops appreciated. Abdomen: Soft, non-tender, non-distended with normoactive bowel sounds. No hepatomegaly. No rebound/guarding. No obvious abdominal masses. Msk:  Strength and tone normal for age. Extremities/Skin: Warm and dry. No clubbing or cyanosis. No edema. No rashes or suspicious lesions. Neuro: Alert and oriented X 3. Moves all extremities spontaneously. Gait is normal. CNII-XII grossly in tact. Psych:  Responds to questions appropriately with a normal affect.   [facility] reading (PRIMARY) by  Dr. Milus Glazier: . Chest x-ray shows peribronchial cuffing and some perihilar adenopathy.    ASSESSMENT AND PLAN:  44 y.o. year old male with  This chart was scribed in my presence and reviewed by me personally.    ICD-9-CM ICD-10-CM   1. Cough 786.2 R05 DG Chest 2 View     amoxicillin-clavulanate (AUGMENTIN) 875-125 MG per tablet     HYDROcodone-homatropine (HYCODAN) 5-1.5 MG/5ML syrup  2. Other specified fever 780.60 R50.9 DG Chest 2 View     amoxicillin-clavulanate (AUGMENTIN) 875-125 MG per tablet  3. Wheezing 786.07 R06.2 DG Chest 2 View     amoxicillin-clavulanate (AUGMENTIN) 875-125 MG per tablet  4. Myalgia 729.1 M79.1 DG Chest 2 View     amoxicillin-clavulanate (AUGMENTIN) 875-125 MG per tablet    Signed, Richard Sidle, MD 12/09/2014 9:02 AM

## 2015-04-24 ENCOUNTER — Ambulatory Visit (INDEPENDENT_AMBULATORY_CARE_PROVIDER_SITE_OTHER): Payer: Medicare Other | Admitting: Internal Medicine

## 2015-04-24 VITALS — BP 136/90 | HR 79 | Temp 98.4°F | Resp 17 | Ht 75.0 in | Wt 274.0 lb

## 2015-04-24 DIAGNOSIS — R509 Fever, unspecified: Secondary | ICD-10-CM | POA: Diagnosis not present

## 2015-04-24 DIAGNOSIS — R6883 Chills (without fever): Secondary | ICD-10-CM

## 2015-04-24 DIAGNOSIS — R05 Cough: Secondary | ICD-10-CM | POA: Diagnosis not present

## 2015-04-24 DIAGNOSIS — J101 Influenza due to other identified influenza virus with other respiratory manifestations: Secondary | ICD-10-CM | POA: Diagnosis not present

## 2015-04-24 LAB — POCT CBC
Granulocyte percent: 68.3 %G (ref 37–80)
HEMATOCRIT: 43 % — AB (ref 43.5–53.7)
HEMOGLOBIN: 14.3 g/dL (ref 14.1–18.1)
Lymph, poc: 0.8 (ref 0.6–3.4)
MCH, POC: 25.2 pg — AB (ref 27–31.2)
MCHC: 33.3 g/dL (ref 31.8–35.4)
MCV: 75.6 fL — AB (ref 80–97)
MID (cbc): 0.5 (ref 0–0.9)
MPV: 8.9 fL (ref 0–99.8)
PLATELET COUNT, POC: 140 10*3/uL — AB (ref 142–424)
POC GRANULOCYTE: 2.8 (ref 2–6.9)
POC LYMPH %: 19.9 % (ref 10–50)
POC MID %: 11.8 % (ref 0–12)
RBC: 5.68 M/uL (ref 4.69–6.13)
RDW, POC: 15.3 %
WBC: 4.1 10*3/uL — AB (ref 4.6–10.2)

## 2015-04-24 LAB — POCT INFLUENZA A/B
INFLUENZA A, POC: NEGATIVE
INFLUENZA B, POC: POSITIVE — AB

## 2015-04-24 NOTE — Progress Notes (Signed)
Subjective:    Patient ID: Richard Bautista, male    DOB: [birth year], 45 y.o.   MRN: [id number] By signing my name below, I, Javier Docker, attest that this documentation has been prepared under the direction and in the presence of Ellamae Sia, MD. Electronically Signed: Javier Docker, ER Scribe. 04/24/2015. 11:28 AM.  Chief Complaint  Patient presents with  . Shortness of Breath    patient thinks he has pneumonia     HPI HPI Comments: Richard Bautista is a 45 y.o. male who presents to [facility] complaining of SOB, fever, chills, myalgias and diaphoresis. His first sx was a cough that started three days ago and that night he had fever, chills and myalgias. Since that time he has had subjective fever and chills at night. He has a past hx of pneumonia. He did not get a flu shot this year. 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 He denies HA.  Past Medical History  Diagnosis Date  . Blind one eye     right   . Detached retina   . Pneumonia   . Knee injury    Allergies  Allergen Reactions  . Erythromycin     REACTION: faint, chills  . Other Rash    "All metal. Jewelry."     Current Outpatient Prescriptions on File Prior to Visit  Medication Sig Dispense Refill  . ibuprofen (ADVIL,MOTRIN) 200 MG tablet Take 200 mg by mouth every 6 (six) hours as needed for mild pain.     No current facility-administered medications on file prior to visit.    Review of Systems  Constitutional: Positive for fever and chills.  HENT: Positive for congestion and rhinorrhea. Negative for sore throat.   Respiratory: Positive for cough and shortness of breath.   Neurological: Negative for headaches.      Objective:  BP 136/90 mmHg  Pulse 79  Temp(Src) 98.4 F (36.9 C) (Oral)  Resp 17  Ht  (1.905 m)  Wt 274 lb (124.286 kg)  BMI 34.25 kg/m2  SpO2 98%  Physical Exam  Constitutional: He is oriented to person, place, and time. He appears  well-developed and well-nourished. No distress.  HENT:  Head: Normocephalic and atraumatic.  Throat clear. Clear rhinorrhea.  Eyes: Pupils are equal, round, and reactive to light.  Neck: Neck supple.  No nodes.  Cardiovascular: Normal rate.   Pulmonary/Chest: Effort normal. No respiratory distress.  Lungs clear.  Musculoskeletal: Normal range of motion.  Neurological: He is alert and oriented to person, place, and time. Coordination normal.  Skin: Skin is warm and dry. He is not diaphoretic.  Psychiatric: He has a normal mood and affect. His behavior is normal.  Nursing note and vitals reviewed.  Results for orders placed or performed in visit on 04/24/15  POCT Influenza A/B  Result Value Ref Range   Influenza A, POC Negative Negative   Influenza B, POC Positive (A) Negative  POCT CBC  Result Value Ref Range   WBC 4.1 (A) 4.6 - 10.2 K/uL   Lymph, poc 0.8 0.6 - 3.4   POC LYMPH PERCENT 19.9 10 - 50 %L   MID (cbc) 0.5 0 - 0.9   POC MID % 11.8 0 - 12 %M   POC Granulocyte 2.8 2 - 6.9   Granulocyte percent 68.3 37 - 80 %G   RBC 5.68 4.69 - 6.13 M/uL   Hemoglobin 14.3 14.1 -  18.1 g/dL   HCT, POC 16.1 (A) 09.6 - 53.7 %   MCV 75.6 (A) 80 - 97 fL   MCH, POC 25.2 (A) 27 - 31.2 pg   MCHC 33.3 31.8 - 35.4 g/dL   RDW, POC 04.5 %   Platelet Count, POC 140 (A) 142 - 424 K/uL   MPV 8.9 0 - 99.8 fL       Assessment & Plan:  Influenza B  Fever - Plan: POCT Influenza A/B, POCT CBC  otc meds/bedrest   I have completed the patient encounter in its entirety as documented by the scribe, with editing by me where necessary. Karson Reede P. Merla Riches, M.D.

## 2015-06-10 ENCOUNTER — Encounter (HOSPITAL_COMMUNITY): Payer: Self-pay | Admitting: Emergency Medicine

## 2015-06-10 ENCOUNTER — Emergency Department (HOSPITAL_COMMUNITY)
Admission: EM | Admit: 2015-06-10 | Discharge: 2015-06-10 | Disposition: A | Discharge status: Home / Self Care | Payer: Medicare Other | Attending: Emergency Medicine | Admitting: Emergency Medicine

## 2015-06-10 ENCOUNTER — Emergency Department (HOSPITAL_COMMUNITY): Payer: Medicare Other

## 2015-06-10 DIAGNOSIS — Y9301 Activity, walking, marching and hiking: Secondary | ICD-10-CM | POA: Insufficient documentation

## 2015-06-10 DIAGNOSIS — W228XXA Striking against or struck by other objects, initial encounter: Secondary | ICD-10-CM | POA: Insufficient documentation

## 2015-06-10 DIAGNOSIS — S99921A Unspecified injury of right foot, initial encounter: Secondary | ICD-10-CM | POA: Diagnosis not present

## 2015-06-10 DIAGNOSIS — H5441 Blindness, right eye, normal vision left eye: Secondary | ICD-10-CM | POA: Diagnosis not present

## 2015-06-10 DIAGNOSIS — Y998 Other external cause status: Secondary | ICD-10-CM | POA: Insufficient documentation

## 2015-06-10 DIAGNOSIS — Y9289 Other specified places as the place of occurrence of the external cause: Secondary | ICD-10-CM | POA: Diagnosis not present

## 2015-06-10 DIAGNOSIS — Z87891 Personal history of nicotine dependence: Secondary | ICD-10-CM | POA: Diagnosis not present

## 2015-06-10 DIAGNOSIS — M7989 Other specified soft tissue disorders: Secondary | ICD-10-CM | POA: Diagnosis not present

## 2015-06-10 DIAGNOSIS — Z8701 Personal history of pneumonia (recurrent): Secondary | ICD-10-CM | POA: Diagnosis not present

## 2015-06-10 MED ORDER — KETOROLAC TROMETHAMINE 60 MG/2ML IM SOLN
60.0000 mg | Freq: Once | INTRAMUSCULAR | Status: DC
Start: 2015-06-10 — End: 2015-06-10
  Filled 2015-06-10: qty 2

## 2015-06-10 MED ORDER — IBUPROFEN 800 MG PO TABS: 800.0000 mg | ORAL_TABLET | Freq: Three times a day (TID) | ORAL | Status: DC

## 2015-06-10 NOTE — ED Notes (Signed)
Patient presents for right great toe injury. Reports he stumped it on a bench at home 2 nights ago. Denies numbness, tingling, minimal relief with ibuprofen, pedal pulses intact, no obvious deformity, swelling or bruising noted. Rates pain 9/10.

## 2015-06-10 NOTE — ED Notes (Signed)
Ortho tech paged  

## 2015-06-10 NOTE — Discharge Instructions (Signed)
You have been seen today for a toe injury. Your imaging showed a possible degenerative condition in your right foot. This condition is a followed upon by orthopedics. Make an appointment today for Monday, March 20. Follow up with PCP as needed. Return to ED should symptoms worsen.  RESOURCE GUIDE  Chronic Pain Problems: Contact [facility]  [phone] Patients need to be referred by their primary care doctor.  Insufficient Money for Medicine: Contact United Way:  call "211" or [facility] [phone].  No Primary Care Doctor: - Call Health Connect  [phone] - can help you locate a primary care doctor that  accepts your insurance, provides certain services, etc. - Physician Referral Service- [phone]  Agencies that provide inexpensive medical care: - [facility]  [phone] - [facility]  [phone] - [facility]  [phone] - Women's Clinic  [phone] - Planned Parenthood  [phone] [facility]  [phone]  Medicaid-accepting [facility] Providers: - [facility]- [address]  [phone], Mon-Fri 9am-7pm, Sat 9am-1pm - [facility]- [address] [place], [address] [place]  [phone] - [facility]- [address], [address] [place]  [phone] [facility]- [address]  [phone] - Renaye Rakers- [address] [place], [address], [phone]  Only accepts [place] patients after they have their name  applied to their card  Self Pay (no insurance) in [place]: - Sickle Cell Patients: Dr Willey Blade, [facility]  [address] [place], [phone] - [facility]- [address] [place]  [phone]       [facility] [place]- [address]       -     [facility]- see information above (Speak to [employer] if you do not have insurance)       -   [facility]- [address] [place], [phone]       -  [facility]- [address],  [phone]       -  [facility]- [address] [phone]       -  Dr Julio Sicks-  [address], [place], [phone]       -  [facility]- [address] [phone]       -  [facility]- [address] [phone], also [address] [phone]       -    [facility]- [address] [place], [phone], 1st & 3rd Saturday   every month, 10am-1pm  1) Find a Doctor and Pay Out of Pocket Although you won't have to find out who is covered by your insurance plan, it is a good idea to ask around and get recommendations. You will then need to call the office and see if the doctor you have chosen will accept you as a new patient and what types of options they offer for patients who are self-pay. Some doctors offer discounts or will set up payment plans for their patients who do not have insurance, but you will need to ask so you aren't surprised when you get to your appointment.  2) Contact Your Local Health Department Not all health departments have doctors that can see patients for sick visits, but many  do, so it is worth a call to see if yours does. If you don't know where your local health department is, you can check in your phone book. The CDC also has a tool to help you locate your state's health department, and many state websites also have listings of all of their local health departments.  3) Find a Walk-in Clinic If your illness is not likely to be very severe or complicated, you may want to try a walk in clinic. These are popping up all over the country in pharmacies, drugstores, and shopping centers. They're usually staffed by nurse practitioners or physician assistants that have been trained to treat common illnesses and complaints. They're usually fairly quick and inexpensive. However, if you have serious medical issues or  chronic medical problems, these are probably not your best option  STD Testing - [facility] [place], STD Clinic, [address], [place], phone [phone] or [phone].  Monday - Friday, call for an appointment. [facility]- [place] [facility] of [facility] [place], STD Clinic, [address], [place], phone [phone] or [phone].  Monday - Friday, call for an appointment.  Abuse/Neglect: [facility]- [place] Child Abuse Hotline 703 111 6845(336) (804)048-4313 [facility]- [place] Child Abuse Hotline 4025671651213-075-5330 (After Hours)  Emergency Shelter:  [facility] [phone](336) [phone]  Maternity Homes: - [facility] [phone](336) [phone] - [facility] [phone](704) [phone]  MRSA Hotline #:   717-171-7847732-828-9133  [facility] Resources  [facility]  United [facility] [address]                 [address]         [address] [place] [address]  [place]                                               [place]                              [place] Phone:  [phone]                                  Phone:  [phone]                   Phone:  [phone]  [facility], [phone] - [facility] - [phone]- [phone]       -     [facility] in [place], [address],                                  [phone], [facility]  [place] Child Abuse Hotline (908)169-4785(336) (364) 240-4676 or 614-827-7119(336) 678-495-4600 (After Hours)   Behavioral Health Services  Substance Abuse Resources: - [facility] Drug Services  [phone] - [facility] [phone] - [facility] [phone] Floydene Flock- [facility] (231) [phone] - Residential & Outpatient Substance Abuse Program  [phone]  Psychological Services: Tressie Ellis- [facility]  [phone] - [facility]  [phone] - [facility], [phone] [place], [place], ACCESS LINE: [phone] or [phone], [email or web address]  Dental Assistance  If unable to pay or uninsured, contact:  [facility] or [facility] to become qualified for the adult dental clinic.  Patients with Medicaid: [facility] [phone], [phone] [address], [phone]  If unable to pay, or uninsured, contact [facility] [phone]) or [facility] [phone] in [place], [phone] in [facility] [place] [facility] [place]) to become qualified for the adult dental clinic   Other Low-Cost Community Dental Services: - [facility]- [address] [place], [place], [place], [phone], [phone], [address]. [address], 2nd and 4th Thursday of the month at 6:30am.  10 clients each day by appointment, can sometimes see walk-in patients if someone does not show for an appointment. [facility]- [address], [place], [place], [phone], [phone] - [facility]- [address], [place], [place], [address], [phone] - [facility]- [phone] [facility]- [facility]- [phone] [facility]- [facility]- [phone]

## 2015-06-10 NOTE — ED Provider Notes (Signed)
CSN: [id number]     Arrival date & time 06/10/15  1330 History  By signing my name below, I, Tanda Rockers, attest that this documentation has been prepared under the direction and in the presence of Cimberly Stoffel, PA-C. Electronically Signed: Tanda Rockers, ED Scribe. 06/10/2015. 2:07 PM.   Chief Complaint  Patient presents with  . Toe Injury   The history is provided by the patient. No language interpreter was used.     HPI Comments: Richard Bautista is a 45 y.o. male who presents to the Emergency Department complaining of sudden onset, constant, throbbing, 9/10,  right great toe pain x 2 days. Pt was walking in the dark when he stubbed his toe on an oak bench, causing the pain. He took Ibuprofen last night with some relief. Patient did not fall. Denies weakness, numbness, tingling, or any other associated symptoms.  Past Medical History  Diagnosis Date  . Blind one eye     right   . Detached retina   . Pneumonia   . Knee injury    Past Surgical History  Procedure Laterality Date  . Eye surgery     No family history on file. Social History  Substance Use Topics  . Smoking status: Former Smoker    Types: Cigars    Quit date: 02/28/2011  . Smokeless tobacco: Never Used  . Alcohol Use: 0.0 oz/week     Comment: socially    Review of Systems  Musculoskeletal: Positive for joint swelling and arthralgias (right great toe).  Skin: Negative for wound.  Neurological: Negative for weakness and numbness.   Allergies  Erythromycin and Other  Home Medications   Prior to Admission medications   Medication Sig Start Date End Date Taking? Authorizing Provider  ibuprofen (ADVIL,MOTRIN) 200 MG tablet Take 200 mg by mouth every 6 (six) hours as needed for mild pain.    Historical Provider, MD  ibuprofen (ADVIL,MOTRIN) 800 MG tablet Take 1 tablet (800 mg total) by mouth 3 (three) times daily. 06/10/15   [place] C Jesicca Dipierro, PA-C   BP 158/85 mmHg  Pulse 72  Temp(Src) 98.5 F (36.9 C) (Oral)   Resp 18  SpO2 97%   Physical Exam  Constitutional: He is oriented to person, place, and time. He appears well-developed and well-nourished. No distress.  HENT:  Head: Normocephalic and atraumatic.  Eyes: Conjunctivae are normal.  Neck: Neck supple.  Cardiovascular: Normal rate.   Pulmonary/Chest: Effort normal. No respiratory distress.  Musculoskeletal:  Swelling and erythema on medial portion of the right foot just beside the great toe. CMS intact distally. No deformities.   Neurological: He is alert and oriented to person, place, and time.  Skin: Skin is warm and dry.  Psychiatric: He has a normal mood and affect. His behavior is normal.  Nursing note and vitals reviewed.   ED Course  Procedures (including critical care time)  DIAGNOSTIC STUDIES: Oxygen Saturation is 97% on RA, normal by my interpretation.    COORDINATION OF CARE: 2:05 PM-Discussed treatment plan which includes DG R Foot with pt at bedside and pt agreed to plan.   Labs Review Labs Reviewed - No data to display  Imaging Review Dg Foot Complete Right  06/10/2015  CLINICAL DATA:  Swelling and redness medially after hitting foot against solid object EXAM: RIGHT FOOT COMPLETE - 3+ VIEW COMPARISON:  September 09, 2013 FINDINGS: Frontal, oblique, and lateral views were obtained. There is no fracture or dislocation. There is no appreciable joint space narrowing. There  is AP sclerotic focus in the distal aspect of the first metatarsal with lucency at the level of the first MTP joint. This appearance raises question of a mild degree of osteochondritis dissecans in this area. IMPRESSION: Question focus of osteochondritis dissecans in the distal aspect of the first MTP joint. No fracture or dislocation. No appreciable joint space narrowing. Electronically Signed   By: Bretta BangWilliam  Woodruff III M.D.   On: 06/10/2015 14:33   I have personally reviewed and evaluated these images as part of my medical decision-making.   EKG  Interpretation None      MDM   Final diagnoses:  Toe injury, right, initial encounter   [name] presents with a right great toe injury and pain that occurred 2 days ago.  Pain control and x-ray. X-ray shows no fracture, but a finding of questionable osteochondritis dissecans. Home care and return precautions discussed. Patient instructed to follow-up with orthopedics on Monday. Patient was understanding of these instructions and is comfortable with discharge.   I personally performed the services described in this documentation, which was scribed in my presence. The recorded information has been reviewed and is accurate.   Anselm PancoastShawn C Cyris Maalouf, PA-C 06/10/15 1455  Arby BarretteMarcy Pfeiffer, MD 06/15/15 2110

## 2015-10-18 ENCOUNTER — Emergency Department (HOSPITAL_COMMUNITY)
Admission: EM | Admit: 2015-10-18 | Discharge: 2015-10-18 | Disposition: A | Discharge status: Home / Self Care | Payer: Medicare Other | Attending: Dermatology | Admitting: Dermatology

## 2015-10-18 ENCOUNTER — Encounter (HOSPITAL_COMMUNITY): Payer: Self-pay | Admitting: Emergency Medicine

## 2015-10-18 DIAGNOSIS — T63441A Toxic effect of venom of bees, accidental (unintentional), initial encounter: Secondary | ICD-10-CM | POA: Diagnosis not present

## 2015-10-18 DIAGNOSIS — Z5321 Procedure and treatment not carried out due to patient leaving prior to being seen by health care provider: Secondary | ICD-10-CM | POA: Diagnosis not present

## 2015-10-18 NOTE — ED Triage Notes (Signed)
Pt not found in lobby x 2.

## 2015-10-18 NOTE — ED Triage Notes (Addendum)
Pt stated he was feeling better Pt left after triage Pt told to return if anything changes

## 2015-10-18 NOTE — ED Triage Notes (Signed)
Pt states that he got stung by a bee about 2 hrs ago.  No documented allergy to bees.  Only complaining of pain to lt hand where he was stung.

## 2015-12-13 ENCOUNTER — Encounter (HOSPITAL_COMMUNITY): Payer: Self-pay | Admitting: Emergency Medicine

## 2015-12-13 ENCOUNTER — Emergency Department (HOSPITAL_COMMUNITY): Payer: Medicare Other

## 2015-12-13 ENCOUNTER — Emergency Department (HOSPITAL_COMMUNITY)
Admission: EM | Admit: 2015-12-13 | Discharge: 2015-12-13 | Disposition: A | Discharge status: Home / Self Care | Payer: Medicare Other | Attending: Emergency Medicine | Admitting: Emergency Medicine

## 2015-12-13 DIAGNOSIS — X501XXA Overexertion from prolonged static or awkward postures, initial encounter: Secondary | ICD-10-CM | POA: Insufficient documentation

## 2015-12-13 DIAGNOSIS — S39012A Strain of muscle, fascia and tendon of lower back, initial encounter: Secondary | ICD-10-CM | POA: Diagnosis not present

## 2015-12-13 DIAGNOSIS — S3992XA Unspecified injury of lower back, initial encounter: Secondary | ICD-10-CM | POA: Diagnosis present

## 2015-12-13 DIAGNOSIS — Z79899 Other long term (current) drug therapy: Secondary | ICD-10-CM | POA: Insufficient documentation

## 2015-12-13 DIAGNOSIS — Y939 Activity, unspecified: Secondary | ICD-10-CM | POA: Diagnosis not present

## 2015-12-13 DIAGNOSIS — Y929 Unspecified place or not applicable: Secondary | ICD-10-CM | POA: Diagnosis not present

## 2015-12-13 DIAGNOSIS — Z87891 Personal history of nicotine dependence: Secondary | ICD-10-CM | POA: Insufficient documentation

## 2015-12-13 DIAGNOSIS — Y999 Unspecified external cause status: Secondary | ICD-10-CM | POA: Diagnosis not present

## 2015-12-13 DIAGNOSIS — M545 Low back pain: Secondary | ICD-10-CM | POA: Diagnosis not present

## 2015-12-13 MED ORDER — OXYCODONE-ACETAMINOPHEN 5-325 MG PO TABS
1.0000 | ORAL_TABLET | Freq: Once | ORAL | Status: AC
  Administered 2015-12-13: 1 via ORAL
  Filled 2015-12-13: qty 1

## 2015-12-13 MED ORDER — NAPROXEN 500 MG PO TABS: 500.0000 mg | ORAL_TABLET | Freq: Two times a day (BID) | ORAL | 0 refills | Status: DC

## 2015-12-13 MED ORDER — METHOCARBAMOL 500 MG PO TABS: 500.0000 mg | ORAL_TABLET | Freq: Two times a day (BID) | ORAL | 0 refills | Status: DC

## 2015-12-13 MED ORDER — HYDROCODONE-ACETAMINOPHEN 5-325 MG PO TABS: 1.0000 | ORAL_TABLET | Freq: Four times a day (QID) | ORAL | 0 refills | Status: DC | PRN

## 2015-12-13 MED ORDER — KETOROLAC TROMETHAMINE 60 MG/2ML IM SOLN
60.0000 mg | Freq: Once | INTRAMUSCULAR | Status: AC
  Administered 2015-12-13: 60 mg via INTRAMUSCULAR
  Filled 2015-12-13: qty 2

## 2015-12-13 MED ORDER — DIAZEPAM 5 MG PO TABS
5.0000 mg | ORAL_TABLET | Freq: Once | ORAL | Status: AC
  Administered 2015-12-13: 5 mg via ORAL
  Filled 2015-12-13: qty 1

## 2015-12-13 NOTE — ED Notes (Signed)
Patient complaining of bilateral lower back pain. Denies dysuria or hematuria. Patient ambulatory from triage.

## 2015-12-13 NOTE — ED Provider Notes (Signed)
[facility]-EMERGENCY DEPT Provider Note   CSN: [id number] Arrival date & time: 12/13/15  [id number]     History   Chief Complaint Chief Complaint  Patient presents with  . Back Pain    HPI Richard Bautista is a 45 y.o. male.  HPI Richard Bautista is a 45 y.o. male presents to emergency room complaining of back pain. Patient states that his back pain started suddenly yesterday afternoon. States pain is in the lower back, it is worse with movement. He denies pain radiating down his extremities. No trouble controlling his bowels or bladder. Denies any urinary symptoms. No abdominal pain. No fever or chills. No IV drug use. He reports back injury years ago doing martial arts. He is unsure exactly what was wrong. He states he had to come to the hospital and get treatment. He states he was doing some back bridges 2 days ago trying to stretch. He states he does them few days a week. He also reports lifting weights as exercise but had denies doing it in the last week. He denies any other injuries. He has tried taking 3 Aleve yesterday for pain with no relief. He did not take any medications this morning. States today he is unable to move or walk without pain.  Past Medical History:  Diagnosis Date  . Blind one eye    right   . Detached retina   . Knee injury   . Pneumonia     Patient Active Problem List   Diagnosis Date Noted  . Pain of right great toe 09/09/2013  . HYPERTRIGLYCERIDEMIA 05/06/2008  . COUGH 04/19/2008  . OBESITY 02/13/2008  . TOBACCO ABUSE 02/13/2008  . HYPERTENSION, BENIGN ESSENTIAL 02/13/2008  . GERD 02/13/2008  . 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 EYE SURGERY         Home Medications    Prior to Admission medications   Medication Sig Start Date End Date Taking? Authorizing Provider  ibuprofen (ADVIL,MOTRIN) 200 MG tablet Take 200 mg by mouth every 6 (six) hours as needed for mild pain.    Historical  Provider, MD  ibuprofen (ADVIL,MOTRIN) 800 MG tablet Take 1 tablet (800 mg total) by mouth 3 (three) times daily. 06/10/15   Anselm PancoastShawn C Joy, PA-C    Family History History reviewed. No pertinent family history.  Social History Social History  Substance Use Topics  . Smoking status: Former Smoker    Types: Cigars    Quit date: 02/28/2011  . Smokeless tobacco: Never Used  . Alcohol use 0.0 oz/week     Comment: socially     Allergies   Erythromycin and Other   Review of Systems Review of Systems  Constitutional: Negative for chills and fever.  Respiratory: Negative for cough, chest tightness and shortness of breath.   Cardiovascular: Negative for chest pain, palpitations and leg swelling.  Gastrointestinal: Negative for abdominal distention, abdominal pain, diarrhea, nausea and vomiting.  Genitourinary: Negative for dysuria, flank pain, frequency, hematuria and urgency.  Musculoskeletal: Positive for arthralgias, back pain and myalgias. Negative for neck pain and neck stiffness.  Skin: Negative for rash.  Allergic/Immunologic: Negative for immunocompromised state.  Neurological: Negative for dizziness, weakness, light-headedness, numbness and headaches.  All other systems reviewed and are negative.    Physical Exam Updated Vital Signs BP 168/96 (BP Location: Left Arm)   Pulse 74   Temp 97.9 F (36.6 C) (Oral)   Resp 18  SpO2 99%   Physical Exam  Constitutional: He appears well-developed and well-nourished. No distress.  HENT:  Head: Normocephalic.  Neck: Normal range of motion. Neck supple.  Cardiovascular: Normal rate, regular rhythm and normal heart sounds.   Pulmonary/Chest: Effort normal and breath sounds normal. No respiratory distress. He has no wheezes. He has no rales.  Abdominal: Soft. There is no tenderness.  Musculoskeletal:  ttp over bilateral perispinal lumbar muscles. No midline tenderness. No pain with bilateral straight leg raise.   Neurological:    5/5 and equal lower extremity strength. 2+ and equal patellar reflexes bilaterally. Pt able to dorsiflex bilateral toes and feet with good strength against resistance. Equal sensation bilaterally over thighs and lower legs.   Skin: Skin is warm and dry.  Nursing note and vitals reviewed.    ED Treatments / Results  Labs (all labs ordered are listed, but only abnormal results are displayed) Labs Reviewed - No data to display  EKG  EKG Interpretation None       Radiology No results found.  Procedures Procedures (including critical care time)  Medications Ordered in ED Medications  ketorolac (TORADOL) injection 60 mg (60 mg Intramuscular Given 12/13/15 0856)  oxyCODONE-acetaminophen (PERCOCET/ROXICET) 5-325 MG per tablet 1 tablet (1 tablet Oral Given 12/13/15 0854)  diazepam (VALIUM) tablet 5 mg (5 mg Oral Given 12/13/15 0854)     Initial Impression / Assessment and Plan / ED Course  I have reviewed the triage vital signs and the nursing notes.  Pertinent labs & imaging results that were available during my care of the patient were reviewed by me and considered in my medical decision making (see chart for details).  Clinical Course    Patient with lower back pain onset yesterday. He reports some stretching and lifting weights in the last several weeks but no acute injury otherwise. Patient's pain is reported reducible with movement and palpation of the lower back. No evidence of cauda equina based on exam. No radicular symptoms. History of similar pain in the past. Most likely muscular pain, possibly muscle spasms. Patient treated in ED with Percocet and Valium and Toradol. He feels much better. He was able to ambulate with minimal pain. X-rays negative. Plan to discharge home with robaxin, naprosyn, norco. Follow up with pcp  Final Clinical Impressions(s) / ED Diagnoses   Final diagnoses:  Lumbar strain, initial encounter    New Prescriptions New Prescriptions    HYDROCODONE-ACETAMINOPHEN (NORCO) 5-325 MG TABLET    Take 1 tablet by mouth every 6 (six) hours as needed for moderate pain.   METHOCARBAMOL (ROBAXIN) 500 MG TABLET    Take 1 tablet (500 mg total) by mouth 2 (two) times daily.   NAPROXEN (NAPROSYN) 500 MG TABLET    Take 1 tablet (500 mg total) by mouth 2 (two) times daily.     Jaynie Crumble, PA-C 12/13/15 1610    Azalia Bilis, MD 12/13/15 [phone]

## 2015-12-13 NOTE — ED Triage Notes (Signed)
Pt states lower medial back pain onset yesterday afternoon. No injury. Reports unusual stretching activity 2 days ago and "sitting strange" yesterday. Ambualtory. No bowel incontinence. 1 episode urinary incontinence yesterday s/t not being able to get to the bathroom in time.

## 2015-12-13 NOTE — ED Notes (Signed)
Discharge instructions, follow up care, and prescriptions reviewed with patient. Patient verbalized understanding. 

## 2015-12-13 NOTE — Discharge Instructions (Signed)
Take naprosyn for pain and inflammation as prescribed. Take Robaxin for muscle spasms. Norco for severe pain. Try to rest, heating pads. Follow up with primary care doctor. Return if worsening.

## 2015-12-13 NOTE — ED Notes (Addendum)
Patient able to ambulate without difficulty. PA made aware.

## 2015-12-13 NOTE — ED Notes (Signed)
PA at bedside.

## 2015-12-13 NOTE — ED Notes (Signed)
Patient transported to X-ray 

## 2016-02-20 ENCOUNTER — Ambulatory Visit: Payer: Medicare Other

## 2016-05-24 ENCOUNTER — Emergency Department (HOSPITAL_COMMUNITY)
Admission: EM | Admit: 2016-05-24 | Discharge: 2016-05-24 | Disposition: A | Discharge status: Home / Self Care | Payer: Medicare Other | Attending: Emergency Medicine | Admitting: Emergency Medicine

## 2016-05-24 ENCOUNTER — Encounter (HOSPITAL_COMMUNITY): Payer: Self-pay | Admitting: Emergency Medicine

## 2016-05-24 DIAGNOSIS — G510 Bell's palsy: Secondary | ICD-10-CM

## 2016-05-24 DIAGNOSIS — I10 Essential (primary) hypertension: Secondary | ICD-10-CM | POA: Diagnosis not present

## 2016-05-24 DIAGNOSIS — Z87891 Personal history of nicotine dependence: Secondary | ICD-10-CM | POA: Insufficient documentation

## 2016-05-24 DIAGNOSIS — Z79899 Other long term (current) drug therapy: Secondary | ICD-10-CM | POA: Insufficient documentation

## 2016-05-24 DIAGNOSIS — R29810 Facial weakness: Secondary | ICD-10-CM | POA: Diagnosis present

## 2016-05-24 LAB — CBG MONITORING, ED: GLUCOSE-CAPILLARY: 98 mg/dL (ref 65–99)

## 2016-05-24 MED ORDER — VALACYCLOVIR HCL 500 MG PO TABS
1000.0000 mg | ORAL_TABLET | Freq: Once | ORAL | Status: AC
Start: 2016-05-24 — End: 2016-05-24
  Administered 2016-05-24: 1000 mg via ORAL
  Filled 2016-05-24: qty 2

## 2016-05-24 MED ORDER — VALACYCLOVIR HCL 1 G PO TABS: 1000.0000 mg | ORAL_TABLET | Freq: Three times a day (TID) | ORAL | 0 refills | Status: DC

## 2016-05-24 MED ORDER — ARTIFICIAL TEARS OP OINT
TOPICAL_OINTMENT | Freq: Every evening | OPHTHALMIC | Status: DC | PRN
  Administered 2016-05-24: 23:00:00 via OPHTHALMIC
  Filled 2016-05-24: qty 3.5

## 2016-05-24 MED ORDER — PREDNISONE 20 MG PO TABS: 40.0000 mg | ORAL_TABLET | Freq: Every day | ORAL | 0 refills | Status: DC

## 2016-05-24 MED ORDER — PREDNISONE 20 MG PO TABS
60.0000 mg | ORAL_TABLET | Freq: Once | ORAL | Status: AC
  Administered 2016-05-24: 60 mg via ORAL
  Filled 2016-05-24: qty 3

## 2016-05-24 NOTE — ED Provider Notes (Signed)
[facility]-EMERGENCY DEPT Provider Note   CSN: [id number] Arrival date & time: 05/24/16  1948     History   Chief Complaint Chief Complaint  Patient presents with  . Facial Droop  . Aphasia    HPI Richard Bautista is a 46 y.o. male.  Patient is a 46 year old male presenting today with left-sided facial droop that started yesterday and worsened today. Because the droop is so significant he also has some mild slurred speech. He denies any sensory deficits, numbness, weakness in the upper or lower extremities, change in vision, difficulty ambulating. He denies any recent medication changes or increased stress, hospitalizations or surgeries. He denies any pain.   The history is provided by the patient.    Past Medical History:  Diagnosis Date  . Blind one eye    right   . Detached retina   . Knee injury   . Pneumonia     Patient Active Problem List   Diagnosis Date Noted  . Pain of right great toe 09/09/2013  . HYPERTRIGLYCERIDEMIA 05/06/2008  . COUGH 04/19/2008  . OBESITY 02/13/2008  . TOBACCO ABUSE 02/13/2008  . HYPERTENSION, BENIGN ESSENTIAL 02/13/2008  . GERD 02/13/2008  . 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 EYE SURGERY         Home Medications    Prior to Admission medications   Medication Sig Start Date End Date Taking? Authorizing Provider  ibuprofen (ADVIL,MOTRIN) 200 MG tablet Take 200 mg by mouth every 6 (six) hours as needed for mild pain.   Yes Historical Provider, MD  Multiple Vitamin (MULTIVITAMIN WITH MINERALS) TABS tablet Take 1 tablet by mouth daily.   Yes Historical Provider, MD  OVER THE COUNTER MEDICATION Take 1 tablet by mouth daily.   Yes Historical Provider, MD  OVER THE COUNTER MEDICATION Take 5 each by mouth daily. Per 16 ounces of water   Yes Historical Provider, MD  TURMERIC PO Take 1 tablet by mouth daily.   Yes Historical Provider, MD  HYDROcodone-acetaminophen (NORCO)  5-325 MG tablet Take 1 tablet by mouth every 6 (six) hours as needed for moderate pain. Patient not taking: Reported on 05/24/2016 12/13/15   Tatyana Kirichenko, PA-C  ibuprofen (ADVIL,MOTRIN) 800 MG tablet Take 1 tablet (800 mg total) by mouth 3 (three) times daily. Patient not taking: Reported on 05/24/2016 06/10/15   Shawn C Joy, PA-C  methocarbamol (ROBAXIN) 500 MG tablet Take 1 tablet (500 mg total) by mouth 2 (two) times daily. Patient not taking: Reported on 05/24/2016 12/13/15   Tatyana Kirichenko, PA-C  naproxen (NAPROSYN) 500 MG tablet Take 1 tablet (500 mg total) by mouth 2 (two) times daily. Patient not taking: Reported on 05/24/2016 12/13/15   Tatyana Kirichenko, PA-C  predniSONE (DELTASONE) 20 MG tablet Take 2 tablets (40 mg total) by mouth daily. 05/24/16   Gwyneth SproutWhitney Aveer Bartow, MD  valACYclovir (VALTREX) 1000 MG tablet Take 1 tablet (1,000 mg total) by mouth 3 (three) times daily. 05/24/16   Gwyneth SproutWhitney Jayceon Troy, MD    Family History History reviewed. No pertinent family history.  Social History Social History  Substance Use Topics  . Smoking status: Former Smoker    Types: Cigars    Quit date: 02/28/2011  . Smokeless tobacco: Never Used  . Alcohol use 0.0 oz/week     Comment: socially     Allergies   Benadryl [diphenhydramine]; Erythromycin; and Other   Review of Systems Review of Systems  All  other systems reviewed and are negative.    Physical Exam Updated Vital Signs BP 152/91 (BP Location: Left Arm)   Pulse (!) 56   Temp 98.4 F (36.9 C) (Oral)   Resp 15   SpO2 95%   Physical Exam  Constitutional: He is oriented to person, place, and time. He appears well-developed and well-nourished. No distress.  HENT:  Head: Normocephalic and atraumatic.  Mouth/Throat: Oropharynx is clear and moist.  Eyes: Conjunctivae and EOM are normal. Pupils are equal, round, and reactive to light.  Neck: Normal range of motion. Neck supple.  Cardiovascular: Normal rate, regular rhythm and  intact distal pulses.   No murmur heard. Pulmonary/Chest: Effort normal and breath sounds normal. No respiratory distress. He has no wheezes. He has no rales.  Abdominal: Soft. He exhibits no distension. There is no tenderness. There is no rebound and no guarding.  Musculoskeletal: Normal range of motion. He exhibits no edema or tenderness.  Neurological: He is alert and oriented to person, place, and time. He has normal strength. A cranial nerve deficit is present. No sensory deficit. Coordination and gait normal.  Left-sided facial droop including the forehead, eyelid and corner of the mouth. Sensation is within normal limits. Normal heel to shin bilaterally. Normal gait. 5 out of 5 strength in bilateral upper and lower extremities. Sensation within normal limits.  Skin: Skin is warm and dry. No rash noted. No erythema.  Psychiatric: He has a normal mood and affect. His behavior is normal.  Nursing note and vitals reviewed.    ED Treatments / Results  Labs (all labs ordered are listed, but only abnormal results are displayed) Labs Reviewed  CBG MONITORING, ED    EKG  EKG Interpretation None       Radiology No results found.  Procedures Procedures (including critical care time)  Medications Ordered in ED Medications  valACYclovir (VALTREX) tablet 1,000 mg (not administered)  predniSONE (DELTASONE) tablet 60 mg (not administered)  artificial tears (LACRILUBE) ophthalmic ointment (not administered)     Initial Impression / Assessment and Plan / ED Course  I have reviewed the triage vital signs and the nursing notes.  Pertinent labs & imaging results that were available during my care of the patient were reviewed by me and considered in my medical decision making (see chart for details).    Patient here with classic Bell's palsy with no other deficits noted. He was given Lacri-Lube ophthalmic ointment because his left eye but does not completely close. He was started on  Valtrex and prednisone as his symptoms have started within the last 24 hours. No prior history of renal disease and is not diabetic.  Final Clinical Impressions(s) / ED Diagnoses   Final diagnoses:  Bell palsy    New Prescriptions New Prescriptions   PREDNISONE (DELTASONE) 20 MG TABLET    Take 2 tablets (40 mg total) by mouth daily.   VALACYCLOVIR (VALTREX) 1000 MG TABLET    Take 1 tablet (1,000 mg total) by mouth 3 (three) times daily.     Gwyneth Sprout, MD 05/24/16 2225

## 2016-05-24 NOTE — ED Triage Notes (Addendum)
Pt c/o left side facial droop of upper, mid, and lower face, left eye watery, left eye difficulty to close, intermittent posterior headache onset last night, slurred speech onset today at 1100. No other new symptoms today, no new symptoms since 1100. No weakness, confusion, hearing changes, vision changes, dizziness. No hx bells palsy, no recent viral infections.

## 2016-12-31 ENCOUNTER — Emergency Department (HOSPITAL_COMMUNITY)
Admission: EM | Admit: 2016-12-31 | Discharge: 2016-12-31 | Disposition: A | Discharge status: Home / Self Care | Payer: Medicare Other | Attending: Emergency Medicine | Admitting: Emergency Medicine

## 2016-12-31 ENCOUNTER — Encounter (HOSPITAL_COMMUNITY): Payer: Self-pay | Admitting: Emergency Medicine

## 2016-12-31 DIAGNOSIS — R05 Cough: Secondary | ICD-10-CM | POA: Diagnosis not present

## 2016-12-31 DIAGNOSIS — Z79899 Other long term (current) drug therapy: Secondary | ICD-10-CM | POA: Insufficient documentation

## 2016-12-31 DIAGNOSIS — J9801 Acute bronchospasm: Secondary | ICD-10-CM

## 2016-12-31 DIAGNOSIS — F17210 Nicotine dependence, cigarettes, uncomplicated: Secondary | ICD-10-CM | POA: Insufficient documentation

## 2016-12-31 DIAGNOSIS — J209 Acute bronchitis, unspecified: Secondary | ICD-10-CM | POA: Diagnosis not present

## 2016-12-31 DIAGNOSIS — R5383 Other fatigue: Secondary | ICD-10-CM | POA: Diagnosis present

## 2016-12-31 MED ORDER — PREDNISONE 20 MG PO TABS
60.0000 mg | ORAL_TABLET | Freq: Once | ORAL | Status: AC
  Administered 2016-12-31: 60 mg via ORAL
  Filled 2016-12-31: qty 3

## 2016-12-31 MED ORDER — DOXYCYCLINE HYCLATE 100 MG PO TABS
100.0000 mg | ORAL_TABLET | Freq: Once | ORAL | Status: AC
  Administered 2016-12-31: 100 mg via ORAL
  Filled 2016-12-31: qty 1

## 2016-12-31 MED ORDER — DOXYCYCLINE HYCLATE 100 MG PO CAPS: 100.0000 mg | ORAL_CAPSULE | Freq: Two times a day (BID) | ORAL | 0 refills | Status: DC

## 2016-12-31 MED ORDER — ALBUTEROL SULFATE HFA 108 (90 BASE) MCG/ACT IN AERS
2.0000 | INHALATION_SPRAY | RESPIRATORY_TRACT | Status: DC | PRN
  Administered 2016-12-31: 2 via RESPIRATORY_TRACT
  Filled 2016-12-31: qty 6.7

## 2016-12-31 MED ORDER — PREDNISONE 20 MG PO TABS: 20.0000 mg | ORAL_TABLET | Freq: Two times a day (BID) | ORAL | 0 refills | Status: DC

## 2016-12-31 MED ORDER — BENZONATATE 100 MG PO CAPS: 100.0000 mg | ORAL_CAPSULE | Freq: Three times a day (TID) | ORAL | 0 refills | Status: DC

## 2016-12-31 NOTE — ED Triage Notes (Signed)
Patient reports yesterday started feeling fatigued, felt running fever but didn't check temperature, reports cough with greenish phlegm.  Reports feels "having difficulty breathing", patient able to speak in full sentences with O2 saturation remaining 98-100% on room air.

## 2016-12-31 NOTE — Discharge Instructions (Signed)
Take antibiotics as prescribed. Albuterol inhaler as needed every 4 hours.

## 2016-12-31 NOTE — ED Provider Notes (Signed)
[facility] Provider Note   CSN: [id number] Arrival date & time: 12/31/16  0802     History   Chief Complaint Chief Complaint  Patient presents with  . Fatigue  . Cough    HPI Richard Bautista is a 46 y.o. male. Chief complaint is cough.  HPI patient is 89. No history ofheart or lung disease. He states had some fatigue and felt some chills and body aches yesterday. Has a cough productive of some green phlegm.  History of reactive airways disease. No constitutional symptoms of fever chills this morning. No body aches. No headache. No sinus symptoms. No GI complaints.  Past Medical History:  Diagnosis Date  . Blind one eye    right   . Detached retina   . Knee injury   . Pneumonia     Patient Active Problem List   Diagnosis Date Noted  . Pain of right great toe 09/09/2013  . HYPERTRIGLYCERIDEMIA 05/06/2008  . COUGH 04/19/2008  . OBESITY 02/13/2008  . TOBACCO ABUSE 02/13/2008  . HYPERTENSION, BENIGN ESSENTIAL 02/13/2008  . GERD 02/13/2008  . 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 EYE SURGERY         Home Medications    Prior to Admission medications   Medication Sig Start Date End Date Taking? Authorizing Provider  benzonatate (TESSALON) 100 MG capsule Take 1 capsule (100 mg total) by mouth every 8 (eight) hours. 12/31/16   Rolland Porter, MD  doxycycline (VIBRAMYCIN) 100 MG capsule Take 1 capsule (100 mg total) by mouth 2 (two) times daily. 12/31/16   Rolland Porter, MD  HYDROcodone-acetaminophen (NORCO) 5-325 MG tablet Take 1 tablet by mouth every 6 (six) hours as needed for moderate pain. Patient not taking: Reported on 05/24/2016 12/13/15   Jaynie Crumble, PA-C  ibuprofen (ADVIL,MOTRIN) 200 MG tablet Take 200 mg by mouth every 6 (six) hours as needed for mild pain.    [provider]  ibuprofen (ADVIL,MOTRIN) 800 MG tablet Take 1 tablet (800 mg total) by mouth 3 (three) times  daily. Patient not taking: Reported on 05/24/2016 06/10/15   Joy, Hillard Danker, PA-C  methocarbamol (ROBAXIN) 500 MG tablet Take 1 tablet (500 mg total) by mouth 2 (two) times daily. Patient not taking: Reported on 05/24/2016 12/13/15   Jaynie Crumble, PA-C  Multiple Vitamin (MULTIVITAMIN WITH MINERALS) TABS tablet Take 1 tablet by mouth daily.    [provider]  naproxen (NAPROSYN) 500 MG tablet Take 1 tablet (500 mg total) by mouth 2 (two) times daily. Patient not taking: Reported on 05/24/2016 12/13/15   Kirichenko, Lemont Fillers, PA-C  OVER THE COUNTER MEDICATION Take 1 tablet by mouth daily.    [provider]  OVER THE COUNTER MEDICATION Take 5 each by mouth daily. Per 16 ounces of water    [provider]  predniSONE (DELTASONE) 20 MG tablet Take 1 tablet (20 mg total) by mouth 2 (two) times daily with a meal. 12/31/16   Rolland Porter, MD  TURMERIC PO Take 1 tablet by mouth daily.    [provider]  valACYclovir (VALTREX) 1000 MG tablet Take 1 tablet (1,000 mg total) by mouth 3 (three) times daily. 05/24/16   Gwyneth Sprout, MD    Family History No family history on file.  Social History Social History  Substance Use Topics  . Smoking status: Current Every Day Smoker    Types: Cigars, Cigarettes    Last attempt to  quit: 02/28/2011  . Smokeless tobacco: Never Used  . Alcohol use 0.0 oz/week     Comment: socially     Allergies   Benadryl [diphenhydramine]; Erythromycin; and Other   Review of Systems Review of Systems  Constitutional: Positive for fatigue. Negative for appetite change, chills, diaphoresis and fever.  HENT: Negative for mouth sores, sore throat and trouble swallowing.   Eyes: Negative for visual disturbance.  Respiratory: Positive for cough and wheezing. Negative for chest tightness and shortness of breath.   Cardiovascular: Negative for chest pain.  Gastrointestinal: Negative for abdominal distention, abdominal pain, diarrhea, nausea  and vomiting.  Endocrine: Negative for polydipsia, polyphagia and polyuria.  Genitourinary: Negative for dysuria, frequency and hematuria.  Musculoskeletal: Negative for gait problem.  Skin: Negative for color change, pallor and rash.  Neurological: Negative for dizziness, syncope, light-headedness and headaches.  Hematological: Does not bruise/bleed easily.  Psychiatric/Behavioral: Negative for behavioral problems and confusion.     Physical Exam Updated Vital Signs BP (!) 167/95 (BP Location: Right Arm)   Pulse 82   Temp 98.3 F (36.8 C) (Oral)   Resp 16   Ht  (1.905 m)   Wt 113.4 kg (250 lb)   SpO2 100%   BMI 31.25 kg/m   Physical Exam  Constitutional: He is oriented to person, place, and time. He appears well-developed and well-nourished. No distress.  HENT:  Head: Normocephalic.  Eyes: Pupils are equal, round, and reactive to light. Conjunctivae are normal. No scleral icterus.  Neck: Normal range of motion. Neck supple. No thyromegaly present.  Cardiovascular: Normal rate and regular rhythm.  Exam reveals no gallop and no friction rub.   No murmur heard. Pulmonary/Chest: Effort normal. No respiratory distress. He has wheezes. He has no rales.  No wheezing with quiet respirations. Does have a bronchospastic cough. No asymmetry.  Abdominal: Soft. Bowel sounds are normal. He exhibits no distension. There is no tenderness. There is no rebound.  Musculoskeletal: Normal range of motion.  Neurological: He is alert and oriented to person, place, and time.  Skin: Skin is warm and dry. No rash noted.  Psychiatric: He has a normal mood and affect. His behavior is normal.     ED Treatments / Results  Labs (all labs ordered are listed, but only abnormal results are displayed) Labs Reviewed - No data to display  EKG  EKG Interpretation None       Radiology No results found.  Procedures Procedures (including critical care time)  Medications Ordered in  ED Medications  albuterol (PROVENTIL HFA;VENTOLIN HFA) 108 (90 Base) MCG/ACT inhaler 2 puff (2 puffs Inhalation Given 12/31/16 0832)  predniSONE (DELTASONE) tablet 60 mg (60 mg Oral Given 12/31/16 0831)  doxycycline (VIBRA-TABS) tablet 100 mg (100 mg Oral Given 12/31/16 0831)     Initial Impression / Assessment and Plan / ED Course  I have reviewed the triage vital signs and the nursing notes.  Pertinent labs & imaging results that were available during my care of the patient were reviewed by me and considered in my medical decision making (see chart for details).    Not hypoxemic. Recheck oral temperature bedside by me as normal. With his bronchospasm with cough treatment prednisone and albuterol. Tessalon for cough. Ten day course doxycycline.  Final Clinical Impressions(s) / ED Diagnoses   Final diagnoses:  Acute bronchitis, unspecified organism  Bronchospasm    New Prescriptions New Prescriptions   BENZONATATE (TESSALON) 100 MG CAPSULE    Take 1 capsule (100 mg total)  by mouth every 8 (eight) hours.   DOXYCYCLINE (VIBRAMYCIN) 100 MG CAPSULE    Take 1 capsule (100 mg total) by mouth 2 (two) times daily.   PREDNISONE (DELTASONE) 20 MG TABLET    Take 1 tablet (20 mg total) by mouth 2 (two) times daily with a meal.     Rolland Porter, MD 12/31/16 [phone]

## 2017-01-19 ENCOUNTER — Encounter: Payer: Medicare Other | Admitting: Osteopathic Medicine

## 2017-04-18 ENCOUNTER — Ambulatory Visit (INDEPENDENT_AMBULATORY_CARE_PROVIDER_SITE_OTHER): Payer: Medicare Other | Admitting: Family Medicine

## 2017-04-18 ENCOUNTER — Other Ambulatory Visit: Payer: Self-pay

## 2017-04-18 ENCOUNTER — Encounter: Payer: Self-pay | Admitting: Family Medicine

## 2017-04-18 VITALS — BP 142/82 | HR 108 | Temp 98.7°F | Resp 16 | Ht 75.0 in | Wt 255.8 lb

## 2017-04-18 DIAGNOSIS — L02512 Cutaneous abscess of left hand: Secondary | ICD-10-CM | POA: Diagnosis not present

## 2017-04-18 DIAGNOSIS — L03012 Cellulitis of left finger: Secondary | ICD-10-CM | POA: Diagnosis not present

## 2017-04-18 MED ORDER — CEPHALEXIN 500 MG PO CAPS: 500.0000 mg | ORAL_CAPSULE | Freq: Four times a day (QID) | ORAL | 0 refills | Status: AC

## 2017-04-18 NOTE — Patient Instructions (Addendum)
   IF you received an x-ray today, you will receive an invoice from [facility]. Please contact [facility] at [phone] with questions or concerns regarding your invoice.   IF you received labwork today, you will receive an invoice from [facility]. Please contact [facility] at [phone] with questions or concerns regarding your invoice.   Our billing staff will not be able to assist you with questions regarding bills from these companies.  You will be contacted with the lab results as soon as they are available. The fastest way to get your results is to activate your My Chart account. Instructions are located on the last page of this paperwork. If you have not heard from us regarding the results in 2 weeks, please contact this office.    Paronychia Paronychia is an infection of the skin that surrounds a nail. It usually affects the skin around a fingernail, but it may also occur near a toenail. It often causes pain and swelling around the nail. This condition may come on suddenly or develop over a longer period. In some cases, a collection of pus (abscess) can form near or under the nail. Usually, paronychia is not serious and it clears up with treatment. What are the causes? This condition may be caused by bacteria or fungi. It is commonly caused by either Streptococcus or Staphylococcus bacteria. The bacteria or fungi often cause the infection by getting into the affected area through an opening in the skin, such as a cut or a hangnail. What increases the risk? This condition is more likely to develop in:  People who get their hands wet often, such as those who work as dishwashers, bartenders, or nurses.  People who bite their fingernails or suck their thumbs.  People who trim their nails too short.  People who have hangnails or injured fingertips.  People who get manicures.  People who have diabetes.  What are the signs or symptoms? Symptoms of this condition  include:  Redness and swelling of the skin near the nail.  Tenderness around the nail when you touch the area.  Pus-filled bumps under the cuticle. The cuticle is the skin at the base or sides of the nail.  Fluid or pus under the nail.  Throbbing pain in the area.  How is this diagnosed? This condition is usually diagnosed with a physical exam. In some cases, a sample of pus may be taken from an abscess to be tested in a lab. This can help to determine what type of bacteria or fungi is causing the condition. How is this treated? Treatment for this condition depends on the cause and severity of the condition. If the condition is mild, it may clear up on its own in a few days. Your health care provider may recommend soaking the affected area in warm water a few times a day. When treatment is needed, the options may include:  Antibiotic medicine, if the condition is caused by a bacterial infection.  Antifungal medicine, if the condition is caused by a fungal infection.  Incision and drainage, if an abscess is present. In this procedure, the health care provider will cut open the abscess so the pus can drain out.  Follow these instructions at home:  Soak the affected area in warm water if directed to do so by your health care provider. You may be told to do this for 20 minutes, 2-3 times a day. Keep the area dry in between soakings.  Take medicines only as directed by   your health care provider.  If you were prescribed an antibiotic medicine, finish all of it even if you start to feel better.  Keep the affected area clean.  Do not try to drain a fluid-filled bump yourself.  If you will be washing dishes or performing other tasks that require your hands to get wet, wear rubber gloves. You should also wear gloves if your hands might come in contact with irritating substances, such as cleaners or chemicals.  Follow your health care provider's instructions about: ? Wound care. ? Bandage  (dressing) changes and removal. Contact a health care provider if:  Your symptoms get worse or do not improve with treatment.  You have a fever or chills.  You have redness spreading from the affected area.  You have continued or increased fluid, blood, or pus coming from the affected area.  Your finger or knuckle becomes swollen or is difficult to move. This information is not intended to replace advice given to you by your health care provider. Make sure you discuss any questions you have with your health care provider. Document Released: 09/05/2000 Document Revised: 08/18/2015 Document Reviewed: 02/17/2014 [employer] Interactive Patient Education  2018 [employer]  

## 2017-04-18 NOTE — Progress Notes (Signed)
Chief Complaint  Patient presents with  . middle finger on left hand painful    x 2 days,relentless,throbbing chronic pain, pain level 10/10, took advil with no relieft no advil today.  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    HPI   Pt reports that 2 days ago he slammed his left middle finger in a door when a coworker pushed the fridge and the door swung and slammed on it. He states that it is throbbing but he can move it but the pain is severe  10/10 and there is swelling. He denies a history of hypertension but reports that he feels sweaty from all the pain.     Past Medical History:  Diagnosis Date  . Blind one eye    right   . Detached retina   . Knee injury   . Pneumonia     Current Outpatient Medications  Medication Sig Dispense Refill  . benzonatate (TESSALON) 100 MG capsule Take 1 capsule (100 mg total) by mouth every 8 (eight) hours. (Patient not taking: Reported on 04/18/2017) 21 capsule 0  . cephALEXin (KEFLEX) 500 MG capsule Take 1 capsule (500 mg total) by mouth 4 (four) times daily for 5 days. 20 capsule 0  . doxycycline (VIBRAMYCIN) 100 MG capsule Take 1 capsule (100 mg total) by mouth 2 (two) times daily. (Patient not taking: Reported on 04/18/2017) 20 capsule 0  . HYDROcodone-acetaminophen (NORCO) 5-325 MG tablet Take 1 tablet by mouth every 6 (six) hours as needed for moderate pain. (Patient not taking: Reported on 05/24/2016) 10 tablet 0  . ibuprofen (ADVIL,MOTRIN) 200 MG tablet Take 200 mg by mouth every 6 (six) hours as needed for mild pain.    Marland Kitchen. ibuprofen (ADVIL,MOTRIN) 800 MG tablet Take 1 tablet (800 mg total) by mouth 3 (three) times daily. (Patient not taking: Reported on 05/24/2016) 21 tablet 0  . methocarbamol (ROBAXIN) 500 MG tablet Take 1 tablet (500 mg total) by mouth 2 (two) times daily. (Patient not taking: Reported on 05/24/2016) 20 tablet 0  . Multiple Vitamin (MULTIVITAMIN WITH MINERALS) TABS tablet Take 1 tablet by mouth  daily.    . naproxen (NAPROSYN) 500 MG tablet Take 1 tablet (500 mg total) by mouth 2 (two) times daily. (Patient not taking: Reported on 05/24/2016) 30 tablet 0  . OVER THE COUNTER MEDICATION Take 1 tablet by mouth daily.    Marland Kitchen. OVER THE COUNTER MEDICATION Take 5 each by mouth daily. Per 16 ounces of water    . predniSONE (DELTASONE) 20 MG tablet Take 1 tablet (20 mg total) by mouth 2 (two) times daily with a meal. (Patient not taking: Reported on 04/18/2017) 10 tablet 0  . TURMERIC PO Take 1 tablet by mouth daily.    . valACYclovir (VALTREX) 1000 MG tablet Take 1 tablet (1,000 mg total) by mouth 3 (three) times daily. (Patient not taking: Reported on 04/18/2017) 21 tablet 0   No current facility-administered medications for this visit.     Allergies:  Allergies  Allergen Reactions  . Benadryl [Diphenhydramine] Other (See Comments)    Causes him to be woozy and not be able to control him self  . Erythromycin     REACTION: faint, chills  . Other Rash    "All metal. Jewelry."      Past Surgical History:  Procedure Laterality Date  . EYE SURGERY      Social History   Socioeconomic History  . Marital status: Married  Spouse name: None  . Number of children: None  . Years of education: None  . Highest education level: None  Social Needs  . Financial resource strain: None  . Food insecurity - worry: None  . Food insecurity - inability: None  . Transportation needs - medical: None  . Transportation needs - non-medical: None  Occupational History  . None  Tobacco Use  . Smoking status: Current Every Day Smoker    Types: Cigars, Cigarettes    Last attempt to quit: 02/28/2011    Years since quitting: 6.1  . Smokeless tobacco: Never Used  Substance and Sexual Activity  . Alcohol use: Yes    Alcohol/week: 0.0 oz    Comment: socially  . Drug use: No  . Sexual activity: None  Other Topics Concern  . None  Social History Narrative  . None    No family history on  file.   ROS Review of Systems See HPI Constitution: No fevers or chills No malaise No diaphoresis Skin: No rash or itching Eyes: no blurry vision, no double vision GU: no dysuria or hematuria Neuro: no dizziness or headaches all others reviewed and negative   Objective: Vitals:   04/18/17 1553  BP: (!) 142/82  Pulse: (!) 108  Resp: 16  Temp: 98.7 F (37.1 C)  TempSrc: Oral  SpO2: 96%  Weight: 255 lb 12.8 oz (116 kg)  Height: 6\' 3"  (1.905 m)    Physical Exam  Left wrist and hand with normal range of motion Swelling and fluctuance at the cuticle Cap refill <2s Normal bony exam  Procedure Note Explained incision and drainage to patient. Verbal consent given. Area cleaned and prepped. Using a 18 gauge needle the area was incised and drained with purulent drainage expressed. Pt tolerated procedure well. Home care instructions reviewed.   Assessment and Plan Dale was seen today for middle finger on left hand painful.  Diagnoses and all orders for this visit:  Paronychia of left middle finger Abscess of finger of left hand   Discussed home care Keflex for further treatment Work note given  -     cephALEXin (KEFLEX) 500 MG capsule; Take 1 capsule (500 mg total) by mouth 4 (four) times daily for 5 days.     Reed Dady A Muaz Shorey

## 2017-05-04 ENCOUNTER — Emergency Department (HOSPITAL_COMMUNITY): Payer: Medicare Other

## 2017-05-04 ENCOUNTER — Encounter (HOSPITAL_COMMUNITY): Payer: Self-pay

## 2017-05-04 ENCOUNTER — Emergency Department (HOSPITAL_COMMUNITY)
Admission: EM | Admit: 2017-05-04 | Discharge: 2017-05-04 | Disposition: A | Discharge status: Home / Self Care | Payer: Medicare Other | Attending: Emergency Medicine | Admitting: Emergency Medicine

## 2017-05-04 DIAGNOSIS — I10 Essential (primary) hypertension: Secondary | ICD-10-CM | POA: Insufficient documentation

## 2017-05-04 DIAGNOSIS — F17210 Nicotine dependence, cigarettes, uncomplicated: Secondary | ICD-10-CM | POA: Insufficient documentation

## 2017-05-04 DIAGNOSIS — J111 Influenza due to unidentified influenza virus with other respiratory manifestations: Secondary | ICD-10-CM | POA: Diagnosis not present

## 2017-05-04 DIAGNOSIS — R05 Cough: Secondary | ICD-10-CM | POA: Diagnosis not present

## 2017-05-04 DIAGNOSIS — R509 Fever, unspecified: Secondary | ICD-10-CM | POA: Diagnosis not present

## 2017-05-04 LAB — COMPREHENSIVE METABOLIC PANEL
ALT: 10 U/L — ABNORMAL LOW (ref 17–63)
AST: 18 U/L (ref 15–41)
Albumin: 4.2 g/dL (ref 3.5–5.0)
Alkaline Phosphatase: 48 U/L (ref 38–126)
Anion gap: 7 (ref 5–15)
BUN: 12 mg/dL (ref 6–20)
CHLORIDE: 101 mmol/L (ref 101–111)
CO2: 26 mmol/L (ref 22–32)
CREATININE: 1.31 mg/dL — AB (ref 0.61–1.24)
Calcium: 8.9 mg/dL (ref 8.9–10.3)
GFR calc non Af Amer: >60 mL/min (ref >60)
Glucose, Bld: 97 mg/dL (ref 65–99)
Potassium: 3.9 mmol/L (ref 3.5–5.1)
SODIUM: 134 mmol/L — AB (ref 135–145)
Total Bilirubin: 0.3 mg/dL (ref 0.3–1.2)
Total Protein: 7.4 g/dL (ref 6.5–8.1)

## 2017-05-04 LAB — CBC WITH DIFFERENTIAL/PLATELET
BASOS ABS: 0 10*3/uL (ref 0.0–0.1)
Basophils Relative: 1 %
EOS ABS: 0.1 10*3/uL (ref 0.0–0.7)
EOS PCT: 1 %
HCT: 38.1 % — ABNORMAL LOW (ref 39.0–52.0)
Hemoglobin: 12.7 g/dL — ABNORMAL LOW (ref 13.0–17.0)
Lymphocytes Relative: 15 %
Lymphs Abs: 0.6 10*3/uL — ABNORMAL LOW (ref 0.7–4.0)
MCH: 25.3 pg — ABNORMAL LOW (ref 26.0–34.0)
MCHC: 33.3 g/dL (ref 30.0–36.0)
MCV: 76 fL — ABNORMAL LOW (ref 78.0–100.0)
Monocytes Absolute: 0.5 10*3/uL (ref 0.1–1.0)
Monocytes Relative: 11 %
Neutro Abs: 2.9 10*3/uL (ref 1.7–7.7)
Neutrophils Relative %: 72 %
PLATELETS: 131 10*3/uL — AB (ref 150–400)
RBC: 5.01 MIL/uL (ref 4.22–5.81)
RDW: 14.2 % (ref 11.5–15.5)
WBC: 4 10*3/uL (ref 4.0–10.5)

## 2017-05-04 MED ORDER — SODIUM CHLORIDE 0.9 % IV BOLUS (SEPSIS)
1000.0000 mL | Freq: Once | INTRAVENOUS | Status: AC
  Administered 2017-05-04: 1000 mL via INTRAVENOUS

## 2017-05-04 MED ORDER — OSELTAMIVIR PHOSPHATE 75 MG PO CAPS
75.0000 mg | ORAL_CAPSULE | Freq: Once | ORAL | Status: AC
  Administered 2017-05-04: 75 mg via ORAL
  Filled 2017-05-04: qty 1

## 2017-05-04 MED ORDER — IBUPROFEN 800 MG PO TABS: 800.0000 mg | ORAL_TABLET | Freq: Three times a day (TID) | ORAL | 0 refills | Status: DC | PRN

## 2017-05-04 MED ORDER — OSELTAMIVIR PHOSPHATE 75 MG PO CAPS: 75.0000 mg | ORAL_CAPSULE | Freq: Two times a day (BID) | ORAL | 0 refills | Status: DC

## 2017-05-04 MED ORDER — KETOROLAC TROMETHAMINE 30 MG/ML IJ SOLN
30.0000 mg | Freq: Once | INTRAMUSCULAR | Status: AC
  Administered 2017-05-04: 30 mg via INTRAVENOUS
  Filled 2017-05-04: qty 1

## 2017-05-04 MED ORDER — ACETAMINOPHEN 325 MG PO TABS
650.0000 mg | ORAL_TABLET | Freq: Once | ORAL | Status: AC
  Administered 2017-05-04: 650 mg via ORAL
  Filled 2017-05-04: qty 2

## 2017-05-04 MED ORDER — ONDANSETRON HCL 4 MG/2ML IJ SOLN
4.0000 mg | Freq: Once | INTRAMUSCULAR | Status: AC
  Administered 2017-05-04: 4 mg via INTRAVENOUS
  Filled 2017-05-04: qty 2

## 2017-05-04 NOTE — ED Triage Notes (Signed)
Pt with "flu like" symptoms x 3 days.  Fever.  Cough.  Shortness of breath

## 2017-05-04 NOTE — Discharge Instructions (Signed)
Drink plenty of fluids and follow-up next week if not improving °

## 2017-05-04 NOTE — ED Notes (Signed)
ED Provider at bedside. 

## 2017-05-04 NOTE — ED Provider Notes (Signed)
[facility]-EMERGENCY DEPT Provider Note   CSN: [id number] Arrival date & time: 05/04/17  0908     History   Chief Complaint Chief Complaint  Patient presents with  . Fever  . Cough    HPI Richard Bautista is a 47 y.o. male.  Patient started with a cough aches chills weakness some nausea.  This started yesterday   The history is provided by the patient. No language interpreter was used.  Fever   This is a new problem. The current episode started 3 to 5 hours ago. The problem occurs constantly. The problem has not changed since onset.His temperature was unmeasured prior to arrival. Associated symptoms include cough. Pertinent negatives include no chest pain, no diarrhea, no congestion and no headaches. He has tried nothing for the symptoms. The treatment provided no relief.  Cough  Pertinent negatives include no chest pain and no headaches.    Past Medical History:  Diagnosis Date  . Blind one eye    right   . Detached retina   . Knee injury   . Pneumonia     Patient Active Problem List   Diagnosis Date Noted  . Pain of right great toe 09/09/2013  . HYPERTRIGLYCERIDEMIA 05/06/2008  . COUGH 04/19/2008  . OBESITY 02/13/2008  . TOBACCO ABUSE 02/13/2008  . HYPERTENSION, BENIGN ESSENTIAL 02/13/2008  . GERD 02/13/2008  . 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 EYE SURGERY         Home Medications    Prior to Admission medications   Medication Sig Start Date End Date Taking? Authorizing Provider  acetaminophen (TYLENOL) 500 MG tablet Take 1,000 mg by mouth every 6 (six) hours as needed for mild pain or fever.   Yes [provider]  Dextromethorphan HBr (VICKS DAYQUIL COUGH) 15 MG/15ML LIQD Take 15 mLs by mouth daily as needed (cough).   Yes [provider]  ibuprofen (ADVIL,MOTRIN) 200 MG tablet Take 200 mg by mouth every 6 (six) hours as needed for mild pain.    Yes [provider]  TURMERIC PO Take 1 tablet by mouth daily.   Yes [provider]  benzonatate (TESSALON) 100 MG capsule Take 1 capsule (100 mg total) by mouth every 8 (eight) hours. Patient not taking: Reported on 04/18/2017 12/31/16   Rolland PorterJames, Mark, MD  doxycycline (VIBRAMYCIN) 100 MG capsule Take 1 capsule (100 mg total) by mouth 2 (two) times daily. Patient not taking: Reported on 04/18/2017 12/31/16   Rolland PorterJames, Mark, MD  HYDROcodone-acetaminophen [facility](NORCO) 5-325 MG tablet Take 1 tablet by mouth every 6 (six) hours as needed for moderate pain. Patient not taking: Reported on 05/24/2016 12/13/15   Jaynie CrumbleKirichenko, Tatyana, PA-C  ibuprofen (ADVIL,MOTRIN) 800 MG tablet Take 1 tablet (800 mg total) by mouth 3 (three) times daily. Patient not taking: Reported on 05/24/2016 06/10/15   Anselm PancoastJoy, Shawn C, PA-C  ibuprofen (ADVIL,MOTRIN) 800 MG tablet Take 1 tablet (800 mg total) by mouth every 8 (eight) hours as needed for fever or moderate pain. 05/04/17   Bethann BerkshireZammit, Omer Monter, MD  methocarbamol (ROBAXIN) 500 MG tablet Take 1 tablet (500 mg total) by mouth 2 (two) times daily. Patient not taking: Reported on 05/24/2016 12/13/15   Jaynie CrumbleKirichenko, Tatyana, PA-C  naproxen (NAPROSYN) 500 MG tablet Take 1 tablet (500 mg total) by mouth 2 (two) times daily. Patient not taking: Reported on 05/24/2016 12/13/15   Jaynie CrumbleKirichenko, Tatyana, PA-C  oseltamivir (TAMIFLU) 75 MG  capsule Take 1 capsule (75 mg total) by mouth every 12 (twelve) hours. 05/04/17   Bethann Berkshire, MD  predniSONE (DELTASONE) 20 MG tablet Take 1 tablet (20 mg total) by mouth 2 (two) times daily with a meal. Patient not taking: Reported on 04/18/2017 12/31/16   Rolland Porter, MD  valACYclovir (VALTREX) 1000 MG tablet Take 1 tablet (1,000 mg total) by mouth 3 (three) times daily. Patient not taking: Reported on 05/04/2017 05/24/16   Gwyneth Sprout, MD    Family History History reviewed. No pertinent family history.  Social History Social History   Tobacco Use  .  Smoking status: Current Every Day Smoker    Types: Cigars, Cigarettes    Last attempt to quit: 02/28/2011    Years since quitting: 6.1  . Smokeless tobacco: Never Used  Substance Use Topics  . Alcohol use: Yes    Alcohol/week: 0.0 oz    Comment: socially  . Drug use: No     Allergies   Benadryl [diphenhydramine]; Erythromycin; and Other   Review of Systems Review of Systems  Constitutional: Positive for fever. Negative for appetite change and fatigue.  HENT: Negative for congestion, ear discharge and sinus pressure.   Eyes: Negative for discharge.  Respiratory: Positive for cough.   Cardiovascular: Negative for chest pain.  Gastrointestinal: Negative for abdominal pain and diarrhea.  Genitourinary: Negative for frequency and hematuria.  Musculoskeletal: Positive for arthralgias. Negative for back pain.  Skin: Negative for rash.  Neurological: Negative for seizures and headaches.  Psychiatric/Behavioral: Negative for hallucinations.     Physical Exam Updated Vital Signs BP (!) 159/80 (BP Location: Left Arm)   Pulse 71   Temp 100.1 F (37.8 C) (Oral)   Resp 16   SpO2 97%   Physical Exam  Constitutional: He is oriented to person, place, and time. He appears well-developed.  HENT:  Head: Normocephalic.  Eyes: Conjunctivae and EOM are normal. No scleral icterus.  Neck: Neck supple. No thyromegaly present.  Cardiovascular: Normal rate and regular rhythm. Exam reveals no gallop and no friction rub.  No murmur heard. Pulmonary/Chest: No stridor. He has no wheezes. He has no rales. He exhibits no tenderness.  Abdominal: He exhibits no distension. There is no tenderness. There is no rebound.  Musculoskeletal: Normal range of motion. He exhibits no edema.  Lymphadenopathy:    He has no cervical adenopathy.  Neurological: He is oriented to person, place, and time. He exhibits normal muscle tone. Coordination normal.  Skin: No rash noted. No erythema.  Psychiatric: He has a  normal mood and affect. His behavior is normal.     ED Treatments / Results  Labs (all labs ordered are listed, but only abnormal results are displayed) Labs Reviewed  CBC WITH DIFFERENTIAL/PLATELET - Abnormal; Notable for the following components:      Result Value   Hemoglobin 12.7 (*)    HCT 38.1 (*)    MCV 76.0 (*)    MCH 25.3 (*)    Platelets 131 (*)    Lymphs Abs 0.6 (*)    All other components within normal limits  COMPREHENSIVE METABOLIC PANEL - Abnormal; Notable for the following components:   Sodium 134 (*)    Creatinine, Ser 1.31 (*)    ALT 10 (*)    All other components within normal limits    EKG  EKG Interpretation None       Radiology Dg Chest 2 View  Result Date: 05/04/2017 CLINICAL DATA:  Dry cough since yesterday. EXAM: CHEST  2 VIEW COMPARISON:  Chest x-ray dated December 09, 2014. FINDINGS: The heart size and mediastinal contours are within normal limits. Both lungs are clear. The visualized skeletal structures are unremarkable. IMPRESSION: No active cardiopulmonary disease. Electronically Signed   By: Obie Dredge M.D.   On: 05/04/2017 09:57    Procedures Procedures (including critical care time)  Medications Ordered in ED Medications  oseltamivir (TAMIFLU) capsule 75 mg (not administered)  acetaminophen (TYLENOL) tablet 650 mg (650 mg Oral Given 05/04/17 1144)  sodium chloride 0.9 % bolus 1,000 mL (0 mLs Intravenous Stopped 05/04/17 1344)  ketorolac (TORADOL) 30 MG/ML injection 30 mg (30 mg Intravenous Given 05/04/17 1153)  ondansetron (ZOFRAN) injection 4 mg (4 mg Intravenous Given 05/04/17 1153)     Initial Impression / Assessment and Plan / ED Course  I have reviewed the triage vital signs and the nursing notes.  Pertinent labs & imaging results that were available during my care of the patient were reviewed by me and considered in my medical decision making (see chart for details).     Patient improved with fluids.  Labs unremarkable.   Suspect influenza.  He will be  treated with Tamiflu Motrin and fluids and follow-up   Final Clinical Impressions(s) / ED Diagnoses   Final diagnoses:  Influenza    ED Discharge Orders        Ordered    oseltamivir (TAMIFLU) 75 MG capsule  Every 12 hours     05/04/17 1504    ibuprofen (ADVIL,MOTRIN) 800 MG tablet  Every 8 hours PRN     05/04/17 1504       Bethann Berkshire, MD 05/04/17 1507

## 2017-07-08 ENCOUNTER — Encounter: Payer: Medicare Other | Admitting: Physician Assistant

## 2017-07-12 ENCOUNTER — Encounter: Payer: Medicare Other | Admitting: Physician Assistant

## 2017-12-17 ENCOUNTER — Ambulatory Visit: Payer: Medicare Other | Admitting: Family Medicine

## 2017-12-17 ENCOUNTER — Other Ambulatory Visit: Payer: Self-pay

## 2017-12-17 ENCOUNTER — Encounter: Payer: Self-pay | Admitting: Family Medicine

## 2017-12-17 ENCOUNTER — Ambulatory Visit (INDEPENDENT_AMBULATORY_CARE_PROVIDER_SITE_OTHER): Payer: Medicare Other | Admitting: Family Medicine

## 2017-12-17 VITALS — BP 138/83 | HR 74 | Temp 99.1°F | Ht 75.0 in | Wt 253.8 lb

## 2017-12-17 DIAGNOSIS — M25572 Pain in left ankle and joints of left foot: Secondary | ICD-10-CM

## 2017-12-17 DIAGNOSIS — M109 Gout, unspecified: Secondary | ICD-10-CM | POA: Diagnosis not present

## 2017-12-17 MED ORDER — TRAMADOL HCL 50 MG PO TABS: 50.0000 mg | ORAL_TABLET | Freq: Three times a day (TID) | ORAL | 0 refills | Status: DC | PRN

## 2017-12-17 MED ORDER — INDOMETHACIN 50 MG PO CAPS: 50.0000 mg | ORAL_CAPSULE | Freq: Three times a day (TID) | ORAL | 0 refills | Status: DC | PRN

## 2017-12-17 NOTE — Patient Instructions (Addendum)
Your toe pain and swelling appear to be due to gout.  That may have been related to diet this past weekend.  As symptoms are starting to improve,  can continue anti-inflammatory but with new medication, Indomethacin up to 3 times per day as needed with food.  Do not take that medication with other over-the-counter pain medicines.  Tramadol was prescribed if needed for more severe pain, but that cannot be used if you are driving or operating machinery.  Please follow-up with me in the next 2 to 3 weeks and we can check a gout test at that time.    Follow-up later this week if not improving or worsens  Gout Gout is painful swelling that can occur in some of your joints. Gout is a type of arthritis. This condition is caused by having too much uric acid in your body. Uric acid is a chemical that forms when your body breaks down substances called purines. Purines are important for building body proteins. When your body has too much uric acid, sharp crystals can form and build up inside your joints. This causes pain and swelling. Gout attacks can happen quickly and be very painful (acute gout). Over time, the attacks can affect more joints and become more frequent (chronic gout). Gout can also cause uric acid to build up under your skin and inside your kidneys. What are the causes? This condition is caused by too much uric acid in your blood. This can occur because:  Your kidneys do not remove enough uric acid from your blood. This is the most common cause.  Your body makes too much uric acid. This can occur with some cancers and cancer treatments. It can also occur if your body is breaking down too many red blood cells (hemolytic anemia).  You eat too many foods that are high in purines. These foods include organ meats and some seafood. Alcohol, especially beer, is also high in purines.  A gout attack may be triggered by trauma or stress. What increases the risk? This condition is more likely to  develop in people who:  Have a family history of gout.  Are male and middle-aged.  Are male and have gone through menopause.  Are obese.  Frequently drink alcohol, especially beer.  Are dehydrated.  Lose weight too quickly.  Have an organ transplant.  Have lead poisoning.  Take certain medicines, including aspirin, cyclosporine, diuretics, levodopa, and niacin.  Have kidney disease or psoriasis.  What are the signs or symptoms? An attack of acute gout happens quickly. It usually occurs in just one joint. The most common place is the big toe. Attacks often start at night. Other joints that may be affected include joints of the feet, ankle, knee, fingers, wrist, or elbow. Symptoms may include:  Severe pain.  Warmth.  Swelling.  Stiffness.  Tenderness. The affected joint may be very painful to touch.  Shiny, red, or purple skin.  Chills and fever.  Chronic gout may cause symptoms more frequently. More joints may be involved. You may also have white or yellow lumps (tophi) on your hands or feet or in other areas near your joints. How is this diagnosed? This condition is diagnosed based on your symptoms, medical history, and physical exam. You may have tests, such as:  Blood tests to measure uric acid levels.  Removal of joint fluid with a needle (aspiration) to look for uric acid crystals.  X-rays to look for joint damage.  How is this treated? Treatment for  this condition has two phases: treating an acute attack and preventing future attacks. Acute gout treatment may include medicines to reduce pain and swelling, including:  NSAIDs.  Steroids. These are strong anti-inflammatory medicines that can be taken by mouth (orally) or injected into a joint.  Colchicine. This medicine relieves pain and swelling when it is taken soon after an attack. It can be given orally or through an IV tube.  Preventive treatment may include:  Daily use of smaller doses of NSAIDs  or colchicine.  Use of a medicine that reduces uric acid levels in your blood.  Changes to your diet. You may need to see a specialist about healthy eating (dietitian).  Follow these instructions at home: During a Gout Attack  If directed, apply ice to the affected area: ? Put ice in a plastic bag. ? Place a towel between your skin and the bag. ? Leave the ice on for 20 minutes, 2-3 times a day.  Rest the joint as much as possible. If the affected joint is in your leg, you may be given crutches to use.  Raise (elevate) the affected joint above the level of your heart as often as possible.  Drink enough fluids to keep your urine clear or pale yellow.  Take over-the-counter and prescription medicines only as told by your health care provider.  Do not drive or operate heavy machinery while taking prescription pain medicine.  Follow instructions from your health care provider about eating or drinking restrictions.  Return to your normal activities as told by your health care provider. Ask your health care provider what activities are safe for you. Avoiding Future Gout Attacks  Follow a low-purine diet as told by your dietitian or health care provider. Avoid foods and drinks that are high in purines, including liver, kidney, anchovies, asparagus, herring, mushrooms, mussels, and beer.  Limit alcohol intake to no more than 1 drink a day for nonpregnant women and 2 drinks a day for men. One drink equals 12 oz of beer, 5 oz of wine, or 1 oz of hard liquor.  Maintain a healthy weight or lose weight if you are overweight. If you want to lose weight, talk with your health care provider. It is important that you do not lose weight too quickly.  Start or maintain an exercise program as told by your health care provider.  Drink enough fluids to keep your urine clear or pale yellow.  Take over-the-counter and prescription medicines only as told by your health care provider.  Keep all  follow-up visits as told by your health care provider. This is important. Contact a health care provider if:  You have another gout attack.  You continue to have symptoms of a gout attack after10 days of treatment.  You have side effects from your medicines.  You have chills or a fever.  You have burning pain when you urinate.  You have pain in your lower back or belly. Get help right away if:  You have severe or uncontrolled pain.  You cannot urinate. This information is not intended to replace advice given to you by your health care provider. Make sure you discuss any questions you have with your health care provider. Document Released: 03/09/2000 Document Revised: 08/18/2015 Document Reviewed: 12/23/2014 Elsevier Interactive Patient Education  [employer]2018 [employer]    If you have lab work done today you will be contacted with your lab results within the next 2 weeks.  If you have not heard from us then  please contact us. The fastest way to get your results is to register for My Chart.   IF you received an x-ray today, you will receive an invoice from [facility]. Please contact [facility] at [phone] with questions or concerns regarding your invoice.   IF you received labwork today, you will receive an invoice from [facility]. Please contact [facility] at [phone] with questions or concerns regarding your invoice.   Our billing staff will not be able to assist you with questions regarding bills from these companies.  You will be contacted with the lab results as soon as they are available. The fastest way to get your results is to activate your My Chart account. Instructions are located on the last page of this paperwork. If you have not heard from [place] regarding the results in 2 weeks, please contact this office.

## 2017-12-17 NOTE — Progress Notes (Signed)
Subjective:  By signing my name below, I, Richard Bautista, attest that this documentation has been prepared under the direction and in the presence of [name], MD. Electronically Signed: Stann Bautista, Scribe. 12/17/2017 , 4:19 PM .  Patient was seen in Room 14 .   Patient ID: Richard Bautista, male    DOB: 11/23/1970, 47 y.o.   MRN: [id number] Chief Complaint  Patient presents with  . left foot pain    going 3 days (toes are agitated)   HPI Richard Bautista is a 47 y.o. male Here for left foot pain. Patient states he was laying in bed 2 days ago, and noticed blanket was pretty snug. He tugged on it, and possibly bent it outward a little. But he felt some heaviness on his left great toe. He denies any known injury or twisting. He's felt soreness and sensitivity when something touches the area. He's taken ibuprofen 800 mg about every 6 hours yesterday and today. He's also taken tramadol (previously prescribed for his dental work) once, and tylenol. He was able to get some edge off with ibuprofen; able to work and walk on it today. On Saturday, the day prior to this flare, he had eaten more red meat and drank a 6-pack case of beer. He denies history of stomach issues. He has a history of GERD, but no issues recently.   He was seen in June 2015, right foot pain at the [facility] with some mild degenerative changes.   He works as a Investment banker, operational at [employer].   Patient Active Problem List   Diagnosis Date Noted  . Pain of right great toe 09/09/2013  . HYPERTRIGLYCERIDEMIA 05/06/2008  . COUGH 04/19/2008  . OBESITY 02/13/2008  . TOBACCO ABUSE 02/13/2008  . HYPERTENSION, BENIGN ESSENTIAL 02/13/2008  . GERD 02/13/2008  . RETINAL DETACHMENT, RIGHT EYE, HX OF 08/25/2007   Past Medical History:  Diagnosis Date  . Blind one eye    right   . Detached retina   . Knee injury   . Pneumonia    Past Surgical History:  Procedure Laterality Date  . EYE SURGERY     Allergies  Allergen Reactions  . Benadryl  [Diphenhydramine] Other (See Comments)    Causes him to be woozy and not be able to control him self  . Erythromycin     REACTION: faint, chills  . Other Rash    "All metal. Jewelry."     Prior to Admission medications   Medication Sig Start Date End Date Taking? Authorizing Provider  acetaminophen (TYLENOL) 500 MG tablet Take 1,000 mg by mouth every 6 (six) hours as needed for mild pain or fever.   Yes [provider]  ibuprofen (ADVIL,MOTRIN) 800 MG tablet Take 1 tablet (800 mg total) by mouth every 8 (eight) hours as needed for fever or moderate pain. 05/04/17  Yes Bethann Berkshire, MD  traMADol (ULTRAM) 50 MG tablet Take 50 mg by mouth every 6 (six) hours as needed.   Yes [provider]  TURMERIC PO Take 1 tablet by mouth daily.   Yes [provider]   Social History   Socioeconomic History  . Marital status: Married    Spouse name: Not on file  . Number of children: Not on file  . Years of education: Not on file  . Highest education level: Not on file  Occupational History  . Not on file  Social Needs  . Financial resource strain: Not on file  . Food insecurity:  Worry: Not on file    Inability: Not on file  . Transportation needs:    Medical: Not on file    Non-medical: Not on file  Tobacco Use  . Smoking status: Current Every Day Smoker    Types: Cigars, Cigarettes    Last attempt to quit: 02/28/2011    Years since quitting: 6.8  . Smokeless tobacco: Never Used  Substance and Sexual Activity  . Alcohol use: Yes    Comment: socially  . Drug use: No  . Sexual activity: Not on file  Lifestyle  . Physical activity:    Days per week: Not on file    Minutes per session: Not on file  . Stress: Not on file  Relationships  . Social connections:    Talks on phone: Not on file    Gets together: Not on file    Attends religious service: Not on file    Active member of club or organization: Not on file    Attends meetings of clubs or  organizations: Not on file    Relationship status: Not on file  . Intimate partner violence:    Fear of current or ex partner: Not on file    Emotionally abused: Not on file    Physically abused: Not on file    Forced sexual activity: Not on file  Other Topics Concern  . Not on file  Social History Narrative  . Not on file   Review of Systems  Constitutional: Negative for fatigue and unexpected weight change.  Eyes: Negative for visual disturbance.  Respiratory: Negative for cough, chest tightness and shortness of breath.   Cardiovascular: Negative for chest pain, palpitations and leg swelling.  Gastrointestinal: Negative for abdominal pain and blood in stool.  Musculoskeletal: Positive for arthralgias and myalgias.  Neurological: Negative for dizziness, light-headedness and headaches.       Objective:   Physical Exam  Constitutional: He is oriented to person, place, and time. He appears well-developed and well-nourished. No distress.  HENT:  Head: Normocephalic and atraumatic.  Eyes: Pupils are equal, round, and reactive to light. EOM are normal.  Neck: Neck supple.  Cardiovascular: Normal rate.  Pulmonary/Chest: Effort normal. No respiratory distress.  Musculoskeletal: Normal range of motion.  Left foot: left ankle non tender, 5th metatarsal and navicula non tender, some tissue swelling with faint erythema and tenderness over first MTP medially but skin intact, some soft tissue swelling throughout the first MTP, nails intact, nail folds intact, no significant erythema or tenderness; skin is intact, no rash  Neurological: He is alert and oriented to person, place, and time.  Skin: Skin is warm and dry.  Psychiatric: He has a normal mood and affect. His behavior is normal.  Nursing note and vitals reviewed.   Vitals:   12/17/17 1544  BP: 138/83  Pulse: 74  Temp: 99.1 F (37.3 C)  TempSrc: Oral  SpO2: 97%  Weight: 253 lb 12.8 oz (115.1 kg)  Height: 6\' 3"  (1.905 m)        Assessment & Plan:    [name] is a 47 y.o. male Acute gout involving toe of left foot, unspecified cause - Plan: indomethacin (INDOCIN) 50 MG capsule, traMADol (ULTRAM) 50 MG tablet  Pain of joint of left ankle and foot - Plan: indomethacin (INDOCIN) 50 MG capsule, traMADol (ULTRAM) 50 MG tablet  Based on exam, suspected gout/podagra.  May have been related to diet intake few days prior.   - Early symptoms, start indomethacin  in place of over-the-counter NSAID.  Take with food, possible GI side effects discussed and RTC precautions.    -Short-term tramadol provided for increased pain if needed, side effects discussed and lowest effective dose.    - RTC precautions given if worsened or persistent symptoms  -Recheck to 3 weeks for uric acid testing.   Meds ordered this encounter  Medications  . indomethacin (INDOCIN) 50 MG capsule    Sig: Take 1 capsule (50 mg total) by mouth 3 (three) times daily as needed. With food.    Dispense:  30 capsule    Refill:  0  . traMADol (ULTRAM) 50 MG tablet    Sig: Take 1 tablet (50 mg total) by mouth every 8 (eight) hours as needed.    Dispense:  10 tablet    Refill:  0   Patient Instructions    Your toe pain and swelling appear to be due to gout.  That may have been related to diet this past weekend.  As symptoms are starting to improve,  can continue anti-inflammatory but with new medication, Indomethacin up to 3 times per day as needed with food.  Do not take that medication with other over-the-counter pain medicines.  Tramadol was prescribed if needed for more severe pain, but that cannot be used if you are driving or operating machinery.  Please follow-up with me in the next 2 to 3 weeks and we can check a gout test at that time.    Follow-up later this week if not improving or worsens  Gout Gout is painful swelling that can occur in some of your joints. Gout is a type of arthritis. This condition is caused by having too much  uric acid in your body. Uric acid is a chemical that forms when your body breaks down substances called purines. Purines are important for building body proteins. When your body has too much uric acid, sharp crystals can form and build up inside your joints. This causes pain and swelling. Gout attacks can happen quickly and be very painful (acute gout). Over time, the attacks can affect more joints and become more frequent (chronic gout). Gout can also cause uric acid to build up under your skin and inside your kidneys. What are the causes? This condition is caused by too much uric acid in your blood. This can occur because:  Your kidneys do not remove enough uric acid from your blood. This is the most common cause.  Your body makes too much uric acid. This can occur with some cancers and cancer treatments. It can also occur if your body is breaking down too many red blood cells (hemolytic anemia).  You eat too many foods that are high in purines. These foods include organ meats and some seafood. Alcohol, especially beer, is also high in purines.  A gout attack may be triggered by trauma or stress. What increases the risk? This condition is more likely to develop in people who:  Have a family history of gout.  Are male and middle-aged.  Are male and have gone through menopause.  Are obese.  Frequently drink alcohol, especially beer.  Are dehydrated.  Lose weight too quickly.  Have an organ transplant.  Have lead poisoning.  Take certain medicines, including aspirin, cyclosporine, diuretics, levodopa, and niacin.  Have kidney disease or psoriasis.  What are the signs or symptoms? An attack of acute gout happens quickly. It usually occurs in just one joint. The most common place is the big toe.  Attacks often start at night. Other joints that may be affected include joints of the feet, ankle, knee, fingers, wrist, or elbow. Symptoms may include:  Severe  pain.  Warmth.  Swelling.  Stiffness.  Tenderness. The affected joint may be very painful to touch.  Shiny, red, or purple skin.  Chills and fever.  Chronic gout may cause symptoms more frequently. More joints may be involved. You may also have white or yellow lumps (tophi) on your hands or feet or in other areas near your joints. How is this diagnosed? This condition is diagnosed based on your symptoms, medical history, and physical exam. You may have tests, such as:  Blood tests to measure uric acid levels.  Removal of joint fluid with a needle (aspiration) to look for uric acid crystals.  X-rays to look for joint damage.  How is this treated? Treatment for this condition has two phases: treating an acute attack and preventing future attacks. Acute gout treatment may include medicines to reduce pain and swelling, including:  NSAIDs.  Steroids. These are strong anti-inflammatory medicines that can be taken by mouth (orally) or injected into a joint.  Colchicine. This medicine relieves pain and swelling when it is taken soon after an attack. It can be given orally or through an IV tube.  Preventive treatment may include:  Daily use of smaller doses of NSAIDs or colchicine.  Use of a medicine that reduces uric acid levels in your blood.  Changes to your diet. You may need to see a specialist about healthy eating (dietitian).  Follow these instructions at home: During a Gout Attack  If directed, apply ice to the affected area: ? Put ice in a plastic bag. ? Place a towel between your skin and the bag. ? Leave the ice on for 20 minutes, 2-3 times a day.  Rest the joint as much as possible. If the affected joint is in your leg, you may be given crutches to use.  Raise (elevate) the affected joint above the level of your heart as often as possible.  Drink enough fluids to keep your urine clear or pale yellow.  Take over-the-counter and prescription medicines only as  told by your health care provider.  Do not drive or operate heavy machinery while taking prescription pain medicine.  Follow instructions from your health care provider about eating or drinking restrictions.  Return to your normal activities as told by your health care provider. Ask your health care provider what activities are safe for you. Avoiding Future Gout Attacks  Follow a low-purine diet as told by your dietitian or health care provider. Avoid foods and drinks that are high in purines, including liver, kidney, anchovies, asparagus, herring, mushrooms, mussels, and beer.  Limit alcohol intake to no more than 1 drink a day for nonpregnant women and 2 drinks a day for men. One drink equals 12 oz of beer, 5 oz of wine, or 1 oz of hard liquor.  Maintain a healthy weight or lose weight if you are overweight. If you want to lose weight, talk with your health care provider. It is important that you do not lose weight too quickly.  Start or maintain an exercise program as told by your health care provider.  Drink enough fluids to keep your urine clear or pale yellow.  Take over-the-counter and prescription medicines only as told by your health care provider.  Keep all follow-up visits as told by your health care provider. This is important. Contact a health care  provider if:  You have another gout attack.  You continue to have symptoms of a gout attack after10 days of treatment.  You have side effects from your medicines.  You have chills or a fever.  You have burning pain when you urinate.  You have pain in your lower back or belly. Get help right away if:  You have severe or uncontrolled pain.  You cannot urinate. This information is not intended to replace advice given to you by your health care provider. Make sure you discuss any questions you have with your health care provider. Document Released: 03/09/2000 Document Revised: 08/18/2015 Document Reviewed:  12/23/2014 Elsevier Interactive Patient Education  [employer].    If you have lab work done today you will be contacted with your lab results within the next 2 weeks.  If you have not heard from [place] then please contact us. The fastest way to get your results is to register for My Chart.   IF you received an x-ray today, you will receive an invoice from [facility]. Please contact [facility] at [phone] with questions or concerns regarding your invoice.   IF you received labwork today, you will receive an invoice from [facility]. Please contact [facility] at [phone] with questions or concerns regarding your invoice.   Our billing staff will not be able to assist you with questions regarding bills from these companies.  You will be contacted with the lab results as soon as they are available. The fastest way to get your results is to activate your My Chart account. Instructions are located on the last page of this paperwork. If you have not heard from [place] regarding the results in 2 weeks, please contact this office.      I personally performed the services described in this documentation, which was scribed in my presence. The recorded information has been reviewed and considered for accuracy and completeness, addended by me as needed, and agree with information above.  Signed,   [name], MD [facility].  12/18/17 10:24 PM

## 2018-01-08 ENCOUNTER — Ambulatory Visit: Payer: Medicare Other | Admitting: Family Medicine

## 2018-02-03 ENCOUNTER — Ambulatory Visit: Payer: Medicare Other | Admitting: Family Medicine

## 2018-02-25 ENCOUNTER — Ambulatory Visit (INDEPENDENT_AMBULATORY_CARE_PROVIDER_SITE_OTHER): Payer: Medicare Other | Admitting: Family Medicine

## 2018-02-25 NOTE — Progress Notes (Signed)
Patient not seen - left prior to being seen.

## 2018-03-03 ENCOUNTER — Ambulatory Visit: Payer: Medicare Other | Admitting: Family Medicine

## 2018-03-13 ENCOUNTER — Other Ambulatory Visit: Payer: Self-pay

## 2018-03-13 ENCOUNTER — Emergency Department (HOSPITAL_COMMUNITY)
Admission: EM | Admit: 2018-03-13 | Discharge: 2018-03-13 | Disposition: A | Discharge status: Home / Self Care | Payer: Medicare Other | Attending: Emergency Medicine | Admitting: Emergency Medicine

## 2018-03-13 ENCOUNTER — Encounter (HOSPITAL_COMMUNITY): Payer: Self-pay

## 2018-03-13 DIAGNOSIS — I889 Nonspecific lymphadenitis, unspecified: Secondary | ICD-10-CM | POA: Insufficient documentation

## 2018-03-13 DIAGNOSIS — R590 Localized enlarged lymph nodes: Secondary | ICD-10-CM

## 2018-03-13 DIAGNOSIS — F17290 Nicotine dependence, other tobacco product, uncomplicated: Secondary | ICD-10-CM | POA: Diagnosis not present

## 2018-03-13 DIAGNOSIS — I10 Essential (primary) hypertension: Secondary | ICD-10-CM | POA: Insufficient documentation

## 2018-03-13 DIAGNOSIS — R221 Localized swelling, mass and lump, neck: Secondary | ICD-10-CM | POA: Diagnosis present

## 2018-03-13 NOTE — ED Provider Notes (Signed)
[facility]-EMERGENCY DEPT Provider Note   CSN: [id number] Arrival date & time: 03/13/18  1549     History   Chief Complaint Chief Complaint  Patient presents with  . Abscess    HPI Richard Bautista is a 47 y.o. male.  HPI Patient presents with a swollen bump on his left throat.  Noted for the last few days.  States it is a little painful.  States can feel it when he puts his hand up there.  Has had some pain in his throat and URI symptoms.  States his wife is had the flu.  No weight loss.  No fevers.  He does smoke cigarettes. Past Medical History:  Diagnosis Date  . Blind one eye    right   . Detached retina   . Knee injury   . Pneumonia     Patient Active Problem List   Diagnosis Date Noted  . Pain of right great toe 09/09/2013  . HYPERTRIGLYCERIDEMIA 05/06/2008  . COUGH 04/19/2008  . OBESITY 02/13/2008  . TOBACCO ABUSE 02/13/2008  . HYPERTENSION, BENIGN ESSENTIAL 02/13/2008  . GERD 02/13/2008  . 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 EYE SURGERY          Home Medications    Prior to Admission medications   Medication Sig Start Date End Date Taking? Authorizing Provider  ibuprofen (ADVIL,MOTRIN) 800 MG tablet Take 1 tablet (800 mg total) by mouth every 8 (eight) hours as needed for fever or moderate pain. Patient not taking: Reported on 03/13/2018 05/04/17   Bethann BerkshireZammit, Joseph, MD  indomethacin (INDOCIN) 50 MG capsule Take 1 capsule (50 mg total) by mouth 3 (three) times daily as needed. With food. Patient not taking: Reported on 03/13/2018 12/17/17   Shade FloodGreene, Jeffrey R, MD  traMADol (ULTRAM) 50 MG tablet Take 1 tablet (50 mg total) by mouth every 8 (eight) hours as needed. Patient not taking: Reported on 03/13/2018 12/17/17   Shade FloodGreene, Jeffrey R, MD    Family History No family history on file.  Social History Social History   Tobacco Use  . Smoking status: Current Some Day  Smoker    Types: Cigars    Last attempt to quit: 02/28/2011    Years since quitting: 7.0  . Smokeless tobacco: Never Used  Substance Use Topics  . Alcohol use: Yes    Comment: socially  . Drug use: No     Allergies   Benadryl [diphenhydramine]; Erythromycin; and Other   Review of Systems Review of Systems  Constitutional: Negative for appetite change and fever.  HENT: Positive for congestion and sore throat. Negative for ear discharge and trouble swallowing.   Respiratory: Positive for cough.   Cardiovascular: Negative for chest pain.  Gastrointestinal: Negative for abdominal pain.  Genitourinary: Negative for flank pain.  Musculoskeletal: Negative for back pain.  Skin: Negative for rash.  Neurological: Negative for weakness.  Psychiatric/Behavioral: Negative for confusion.     Physical Exam Updated Vital Signs BP (!) 151/85 (BP Location: Right Arm)   Pulse 81   Temp 98 F (36.7 C) (Oral)   Resp 16   Ht 6\' 3"  (1.905 m)   Wt 113.4 kg   SpO2 95%   BMI 31.25 kg/m   Physical Exam HENT:     Head: Atraumatic.     Nose:     Comments: Mild posterior pharyngeal erythema without exudate.    Mouth/Throat:  Mouth: Mucous membranes are moist.  Neck:     Comments: Anterior cervical lymphadenopathy. Cardiovascular:     Rate and Rhythm: Normal rate.  Pulmonary:     Breath sounds: No wheezing, rhonchi or rales.  Abdominal:     Tenderness: There is no abdominal tenderness.  Lymphadenopathy:     Cervical: Cervical adenopathy present.  Skin:    General: Skin is warm.  Neurological:     General: No focal deficit present.     Mental Status: He is alert.      ED Treatments / Results  Labs (all labs ordered are listed, but only abnormal results are displayed) Labs Reviewed - No data to display  EKG None  Radiology No results found.  Procedures Procedures (including critical care time)  Medications Ordered in ED Medications - No data to  display   Initial Impression / Assessment and Plan / ED Course  I have reviewed the triage vital signs and the nursing notes.  Pertinent labs & imaging results that were available during my care of the patient were reviewed by me and considered in my medical decision making (see chart for details).     Patient with some swelling in his throat.  Patient feels on the left I think it feel bilateral lymphadenopathy.  Does have some URI type symptoms.  Could be the cause of this.  No other nodes felt.  May just be lymphadenopathy from infection.  We will follow-up as an outpatient if symptoms do not improve.  Malignancy felt less likely but he is a smoker.  Final Clinical Impressions(s) / ED Diagnoses   Final diagnoses:  Lymphadenopathy, anterior cervical    ED Discharge Orders    None       Benjiman CorePickering, Marvyn Torrez, MD 03/13/18 2328

## 2018-03-13 NOTE — Discharge Instructions (Addendum)
Follow-up with the primary care doctor if the swelling does not improve.

## 2018-03-13 NOTE — ED Triage Notes (Signed)
Pt states that he has a "node" on his throat that has been bothering him for several days. "node" is on the left side.

## 2018-03-15 ENCOUNTER — Emergency Department (HOSPITAL_COMMUNITY)
Admission: EM | Admit: 2018-03-15 | Discharge: 2018-03-15 | Disposition: A | Discharge status: Home / Self Care | Payer: Medicare Other | Attending: Emergency Medicine | Admitting: Emergency Medicine

## 2018-03-15 ENCOUNTER — Encounter (HOSPITAL_COMMUNITY): Payer: Self-pay | Admitting: *Deleted

## 2018-03-15 DIAGNOSIS — M545 Low back pain, unspecified: Secondary | ICD-10-CM

## 2018-03-15 DIAGNOSIS — R591 Generalized enlarged lymph nodes: Secondary | ICD-10-CM

## 2018-03-15 DIAGNOSIS — F17290 Nicotine dependence, other tobacco product, uncomplicated: Secondary | ICD-10-CM | POA: Diagnosis not present

## 2018-03-15 DIAGNOSIS — I10 Essential (primary) hypertension: Secondary | ICD-10-CM | POA: Insufficient documentation

## 2018-03-15 DIAGNOSIS — R599 Enlarged lymph nodes, unspecified: Secondary | ICD-10-CM | POA: Insufficient documentation

## 2018-03-15 DIAGNOSIS — Z79899 Other long term (current) drug therapy: Secondary | ICD-10-CM | POA: Insufficient documentation

## 2018-03-15 MED ORDER — CYCLOBENZAPRINE HCL 10 MG PO TABS: 10.0000 mg | ORAL_TABLET | Freq: Every day | ORAL | 0 refills | Status: DC

## 2018-03-15 MED ORDER — KETOROLAC TROMETHAMINE 60 MG/2ML IM SOLN
30.0000 mg | Freq: Once | INTRAMUSCULAR | Status: AC
  Administered 2018-03-15: 30 mg via INTRAMUSCULAR
  Filled 2018-03-15: qty 2

## 2018-03-15 MED ORDER — CYCLOBENZAPRINE HCL 10 MG PO TABS
10.0000 mg | ORAL_TABLET | Freq: Once | ORAL | Status: AC
  Administered 2018-03-15: 10 mg via ORAL
  Filled 2018-03-15: qty 1

## 2018-03-15 MED ORDER — IBUPROFEN 600 MG PO TABS: 600.0000 mg | ORAL_TABLET | Freq: Four times a day (QID) | ORAL | 0 refills | Status: DC | PRN

## 2018-03-15 NOTE — ED Provider Notes (Signed)
[facility]-EMERGENCY DEPT Provider Note   CSN: [id number] Arrival date & time: 03/15/18  1659     History   Chief Complaint Chief Complaint  Patient presents with  . Back Pain    HPI Richard Bautista is a 47 y.o. male presenting to the ED with low back pain x 1 day. He noticed the pain started on his right side after helping his coworker lift 100 pounds of ice last night. He describes the pain as an ache and is located in his right paraspinal muscles Pain is worse with movement. The pain does not radiate. He did not take anything for the pain prior to arrival.  Denies fever, chills, saddle anesthesia, numbness, weakness, dyspnea, bowel and bladder incontinence, dysuria, IVDU, history of cancer and weight changes.  Pt also is worried about a swollen bump on the left side of his throat. He was seen here 2 days ago for the same and informed it could lymphadenopathy from his current URI. He continues to have URI symptoms. Admits to sick contacts. The history is provided by a parent. No language interpreter was used.    Past Medical History:  Diagnosis Date  . Blind one eye    right   . Detached retina   . Knee injury   . Pneumonia     Patient Active Problem List   Diagnosis Date Noted  . Pain of right great toe 09/09/2013  . HYPERTRIGLYCERIDEMIA 05/06/2008  . COUGH 04/19/2008  . OBESITY 02/13/2008  . TOBACCO ABUSE 02/13/2008  . HYPERTENSION, BENIGN ESSENTIAL 02/13/2008  . GERD 02/13/2008  . 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 EYE SURGERY          Home Medications    Prior to Admission medications   Medication Sig Start Date End Date Taking? Authorizing Provider  cyclobenzaprine (FLEXERIL) 10 MG tablet Take 1 tablet (10 mg total) by mouth at bedtime. 03/15/18   Tayah Idrovo E, PA-C  ibuprofen (ADVIL,MOTRIN) 600 MG tablet Take 1 tablet (600 mg total) by mouth every 6 (six)  hours as needed. 03/15/18   Damoni Causby E, PA-C  indomethacin (INDOCIN) 50 MG capsule Take 1 capsule (50 mg total) by mouth 3 (three) times daily as needed. With food. Patient not taking: Reported on 03/13/2018 12/17/17   Shade FloodGreene, Jeffrey R, MD  traMADol (ULTRAM) 50 MG tablet Take 1 tablet (50 mg total) by mouth every 8 (eight) hours as needed. Patient not taking: Reported on 03/13/2018 12/17/17   Shade FloodGreene, Jeffrey R, MD    Family History No family history on file.  Social History Social History   Tobacco Use  . Smoking status: Current Some Day Smoker    Types: Cigars    Last attempt to quit: 02/28/2011    Years since quitting: 7.0  . Smokeless tobacco: Never Used  Substance Use Topics  . Alcohol use: Yes    Comment: socially  . Drug use: No     Allergies   Benadryl [diphenhydramine]; Erythromycin; and Other   Review of Systems Review of Systems  Constitutional: Negative for activity change, chills, diaphoresis, fever and unexpected weight change.  HENT: Positive for congestion and sore throat. Negative for ear pain, sinus pressure and sinus pain.   Eyes: Negative for pain.  Respiratory: Positive for cough. Negative for chest tightness, shortness of breath and wheezing.   Cardiovascular: Negative for chest pain and palpitations.  Gastrointestinal: Negative for  abdominal pain.  Genitourinary: Negative for difficulty urinating, dysuria and hematuria.  Musculoskeletal: Positive for back pain. Negative for gait problem, neck pain and neck stiffness.  Skin: Negative for rash.  Neurological: Negative for dizziness, weakness, numbness and headaches.  All other systems reviewed and are negative.    Physical Exam Updated Vital Signs BP (!) 143/86 (BP Location: Right Arm)   Pulse 73   Temp 98.5 F (36.9 C) (Oral)   Resp 18   SpO2 100%   Physical Exam Vitals signs and nursing note reviewed.  Constitutional:      General: He is not in acute distress.    Appearance:  Normal appearance. He is not toxic-appearing or diaphoretic.  HENT:     Head: Normocephalic and atraumatic.     Right Ear: Tympanic membrane normal.     Left Ear: Tympanic membrane normal.     Nose: Nose normal.     Mouth/Throat:     Mouth: Mucous membranes are moist.     Pharynx: Oropharynx is clear.  Eyes:     Pupils: Pupils are equal, round, and reactive to light.  Neck:     Musculoskeletal: Normal range of motion and neck supple. No muscular tenderness.  Cardiovascular:     Rate and Rhythm: Normal rate and regular rhythm.     Pulses: Normal pulses.     Heart sounds: Normal heart sounds.  Pulmonary:     Effort: Pulmonary effort is normal.     Breath sounds: Normal breath sounds.  Abdominal:     Palpations: Abdomen is soft.     Tenderness: There is no abdominal tenderness. There is no guarding.  Musculoskeletal:        General: No swelling.     Lumbar back: He exhibits no tenderness, no swelling and no deformity.     Comments: Nontender over spinous processes. Nontender over paraspinal muscles. Unable to reproduce back pain on palpation. No significant warmth.  Lymphadenopathy:     Cervical: Cervical adenopathy present.  Skin:    General: Skin is warm and dry.     Capillary Refill: Capillary refill takes less than 2 seconds.  Neurological:     General: No focal deficit present.     Mental Status: He is alert and oriented to person, place, and time. Mental status is at baseline.     Cranial Nerves: No cranial nerve deficit.     Sensory: No sensory deficit.     Motor: No weakness.     Coordination: Coordination normal.     Gait: Gait normal.  Psychiatric:        Mood and Affect: Mood normal.        Thought Content: Thought content normal.        Judgment: Judgment normal.      ED Treatments / Results  Labs (all labs ordered are listed, but only abnormal results are displayed) Labs Reviewed - No data to display  EKG None  Radiology No results  found.  Procedures Procedures (including critical care time)  Medications Ordered in ED Medications  ketorolac (TORADOL) injection 30 mg (30 mg Intramuscular Given 03/15/18 2004)  cyclobenzaprine (FLEXERIL) tablet 10 mg (10 mg Oral Given 03/15/18 2006)     Initial Impression / Assessment and Plan / ED Course  I have reviewed the triage vital signs and the nursing notes.  Pertinent labs & imaging results that were available during my care of the patient were reviewed by me and considered in my medical decision making (  see chart for details).     Pt is a 47 yo male presenting to the ED with low back pain x 1 day. The pain started after he was trying to lift 100 pounds of ice. Given that his pain started after lifting something heavy and is only present with movement it is likely muscular pain. His back pain does not have red flags such as weight loss, IVDU, bowel and bladder incontinence, history of cancer, dysuria, and weakness. Back pain treatment was discussed and the use of anti-inflammatories is encouraged to help with his acute pain. Pt treated in the ED with IM Toradol and flexeril in the ED. He feels better and ambulated with minimal pain.   He continues to have the pain in his throat similar to that of 3 days ago when he was seen in the ED. On exam it feels like bilateral lymphadenopathy. His current URI could be the cause and pt advised to follow up with pcp if the symptoms do not improve with the resolution of URI. He is a smoker, but the risk of malignancy is low. Smoking cessation was discussed and patient verbalizes he is trying to quit. Discharged with strict return precautions. He verbalized understanding and agreement to his plan.  Final Clinical Impressions(s) / ED Diagnoses   Final diagnoses:  Lymphadenopathy  Acute right-sided low back pain without sciatica    ED Discharge Orders         Ordered    cyclobenzaprine (FLEXERIL) 10 MG tablet  Daily at bedtime      03/15/18 2018    ibuprofen (ADVIL,MOTRIN) 600 MG tablet  Every 6 hours PRN     03/15/18 2018           Sherene Sireslbrizze, Markeya Mincy E, PA-C 03/15/18 2101    Rolan BuccoBelfi, Melanie, MD 03/15/18 2249

## 2018-03-15 NOTE — ED Triage Notes (Signed)
Pt complains of pain in right side of back since last night when he lifted a box of ice. Pt also complains of a lymph node on the right side of his neck that he was seen for that is still bothering him.

## 2018-03-15 NOTE — Discharge Instructions (Addendum)
The bump you are feeling in your neck could be a result of your current URI. If you continue to feel the bump after your symptoms have resolved please follow up with your pcp for further evaluation.

## 2018-03-20 ENCOUNTER — Other Ambulatory Visit: Payer: Self-pay

## 2018-03-20 ENCOUNTER — Encounter: Payer: Self-pay | Admitting: Physician Assistant

## 2018-03-20 ENCOUNTER — Ambulatory Visit (INDEPENDENT_AMBULATORY_CARE_PROVIDER_SITE_OTHER): Payer: Medicare Other | Admitting: Physician Assistant

## 2018-03-20 ENCOUNTER — Ambulatory Visit: Payer: Self-pay | Admitting: *Deleted

## 2018-03-20 VITALS — BP 134/80 | HR 69 | Temp 98.5°F | Ht 75.0 in | Wt 251.8 lb

## 2018-03-20 DIAGNOSIS — K047 Periapical abscess without sinus: Secondary | ICD-10-CM

## 2018-03-20 DIAGNOSIS — K0889 Other specified disorders of teeth and supporting structures: Secondary | ICD-10-CM | POA: Diagnosis not present

## 2018-03-20 MED ORDER — AMOXICILLIN-POT CLAVULANATE 875-125 MG PO TABS: 1.0000 | ORAL_TABLET | Freq: Two times a day (BID) | ORAL | 0 refills | Status: DC

## 2018-03-20 NOTE — Telephone Encounter (Signed)
Pt states that he was having a hard time chewing on the right side; this morning he reports that the right side of his face (jaw area is swollen); he states that he took 3 ibuprofen; the pt states that his face was normal 03/19/18 at 1600; he also reports right gum pain, and sinus pressure; recommendations made per nurse triage protocol; the pt normally sees Dr Meredith StaggersJeffrey Greene but he has no availability; [facility] [address], 03/20/18 at 0840; he verbalized understanding.    Reason for Disposition . [1] Painful rash AND [2] multiple small blisters grouped together (i.e., dermatomal distribution or "band" or "stripe")  Answer Assessment - Initial Assessment Questions 1. ONSET: "When did the swelling start?" (e.g., minutes, hours, days)     hours 2. LOCATION: "What part of the face is swollen?"     Right jaw 3. SEVERITY: "How swollen is it?"     "a whole lot swollen" 4. ITCHING: "Is there any itching?" If so, ask: "How much?"   (Scale 1-10; mild, moderate or severe)     no 5. PAIN: "Is the swelling painful to touch?" If so, ask: "How painful is it?"   (Scale 1-10; mild, moderate or severe)     Rated 8 out of 10 6. FEVER: "Do you have a fever?" If so, ask: "What is it, how was it measured, and when did it start?"      no 7. CAUSE: "What do you think is causing the face swelling?"     "maybe food allergy" 8. RECURRENT SYMPTOM: "Have you had face swelling before?" If so, ask: "When was the last time?" "What happened that time?"     no 9. OTHER SYMPTOMS: "Do you have any other symptoms?" (e.g., toothache, leg swelling)     "getting over a cold"; pressure in sinuses in forehead area 10. PREGNANCY: "Is there any chance you are pregnant?" "When was your last menstrual period?"       n/a  Protocols used: [facility]LL[facility]

## 2018-03-20 NOTE — Patient Instructions (Addendum)
Start taking Augmentin twice daily for the next 10 days. Take the entire course, even if you feel better sooner.  Also take ibuprofen for pain and swelling.   Please come back in 2-3 weeks for your annual exam. We will recheck your blood pressure and make sure your mouth is feeling better.     Dental Abscess  A dental abscess is a collection of pus in or around a tooth that results from an infection. An abscess can cause pain in the affected area as well as other symptoms. Treatment is important to help with symptoms and to prevent the infection from spreading. What are the causes? This condition is caused by a bacterial infection around the root of the tooth that involves the inner part of the tooth (pulp). It may result from:  Severe tooth decay.  Trauma to the tooth, such as a broken or chipped tooth, that allows bacteria to enter into the pulp.  Severe gum disease around a tooth. What increases the risk? This condition is more likely to develop in males. It is also more likely to develop in people who:  Have dental decay (cavities).  Eat sugary snacks between meals.  Use tobacco products.  Have diabetes.  Have a weakened disease-fighting system (immune system).  Do not brush and care for their teeth regularly. What are the signs or symptoms? Symptoms of this condition include:  Severe pain in and around the infected tooth.  Swelling and redness around the infected tooth, in the mouth, or in the face.  Tenderness.  Pus drainage.  Bad breath.  Bitter taste in the mouth.  Difficulty swallowing.  Difficulty opening the mouth.  Nausea.  Vomiting.  Chills.  Swollen neck glands.  Fever. How is this diagnosed? This condition is diagnosed based on:  Your symptoms and your medical and dental history.  An examination of the infected tooth. During the exam, your dentist may tap on the infected tooth. You may also have X-rays of the affected area. How is  this treated? This condition is treated by getting rid of the infection. This may be done with:  Incision and drainage. This procedure is done by making an incision in the abscess to drain out the pus. Removing pus is the first priority in treating an abscess.  Antibiotic medicines. These may be used in certain situations.  Antibacterial mouth rinse.  A root canal. This may be performed to save the tooth. Your dentist accesses the visible part of your tooth (crown) with a drill and removes any damaged pulp. Then the space is filled and sealed off.  Tooth extraction. The tooth is pulled out if it cannot be saved by other treatment. You may also receive treatment for pain, such as:  Acetaminophen or NSAIDs.  Gels that contain a numbing medicine.  An injection to block the pain near your nerve. Follow these instructions at home: Medicines  Take over-the-counter and prescription medicines only as told by your dentist.  If you were prescribed an antibiotic, take it as told by your dentist. Do not stop taking the antibiotic even if you start to feel better.  If you were prescribed a gel that contains a numbing medicine, use it exactly as told in the directions. Do not use these gels for children who are younger than 402 years of age.  Do not drive or use heavy machinery while taking prescription pain medicine. General instructions  Rinse out your mouth often with salt water to relieve pain or swelling.  To make a salt-water mixture, completely dissolve -1 tsp of salt in 1 cup of warm water.  Eat a soft diet while your abscess is healing.  Drink enough fluid to keep your urine pale yellow.  Do not apply heat to the outside of your mouth.  Do not use any products that contain nicotine or tobacco, such as cigarettes and e-cigarettes. If you need help quitting, ask your health care provider.  Keep all follow-up visits as told by your dentist. This is important. How is this  prevented?  Brush your teeth every morning and night with fluoride toothpaste. Floss one time each day.  Get regularly scheduled dental cleanings.  Consider having a dental sealant applied on teeth that have deep holes (caries).  Drink fluoridated water regularly. This includes most tap water. Check the label on bottled water to see if it contains fluoride.  Drink water instead of sugary drinks.  Eat healthy meals and snacks.  Wear a mouth guard or face shield to protect your teeth while playing sports. Contact a health care provider if:  Your pain is worse and is not helped by medicine. Get help right away if:  You have a fever or chills.  Your symptoms suddenly get worse.  You have a very bad headache.  You have problems breathing or swallowing.  You have trouble opening your mouth.  You have swelling in your neck or around your eye. Summary  A dental abscess is a collection of pus in or around a tooth that results from an infection.  A dental abscess may result from severe tooth decay, trauma to the tooth, or severe gum disease around a tooth.  Symptoms include severe pain, swelling, redness, and drainage of pus in and around the infected tooth.  The first priority in treating a dental abscess is to drain out the pus. Treatment may also involve removing damage inside the tooth (root canal) or pulling out (extracting) the tooth. This information is not intended to replace advice given to you by your health care provider. Make sure you discuss any questions you have with your health care provider. Document Released: 03/12/2005 Document Revised: 11/12/2016 Document Reviewed: 11/12/2016 Elsevier Interactive Patient Education  2019 [employer]

## 2018-03-20 NOTE — Progress Notes (Signed)
Luberta MutterHassan C Ponce  MRN: [id number] DOB: 07/29/1970  PCP: Patient, No Pcp Per  Subjective:  Pt is a 47 year old male who presents to clinic for tooth pain. Right facial swelling started yesterday after eating christmas dinner. Endorses pain on the side of his face and with chewing.  He noted tooth pain this past April, on and off ever since.  Denies fever, chills, bad taste in mouth, throat swelling, drainage.   Review of Systems  Constitutional: Negative for chills and fever.  HENT: Positive for dental problem and facial swelling. Negative for drooling, sinus pressure, sinus pain, trouble swallowing and voice change.     Patient Active Problem List   Diagnosis Date Noted  . Pain of right great toe 09/09/2013  . HYPERTRIGLYCERIDEMIA 05/06/2008  . COUGH 04/19/2008  . OBESITY 02/13/2008  . TOBACCO ABUSE 02/13/2008  . HYPERTENSION, BENIGN ESSENTIAL 02/13/2008  . GERD 02/13/2008  . RETINAL DETACHMENT, RIGHT EYE, HX OF 08/25/2007    Current Outpatient Medications on File Prior to Visit  Medication Sig Dispense Refill  . cyclobenzaprine (FLEXERIL) 10 MG tablet Take 1 tablet (10 mg total) by mouth at bedtime. 10 tablet 0  . ibuprofen (ADVIL,MOTRIN) 600 MG tablet Take 1 tablet (600 mg total) by mouth every 6 (six) hours as needed. 30 tablet 0  . traMADol (ULTRAM) 50 MG tablet Take 1 tablet (50 mg total) by mouth every 8 (eight) hours as needed. (Patient not taking: Reported on 03/13/2018) 10 tablet 0   No current facility-administered medications on file prior to visit.     Allergies  Allergen Reactions  . Benadryl [Diphenhydramine] Other (See Comments)    Causes him to be woozy and not be able to control him self  . Erythromycin     REACTION: faint, chills  . Other Rash    "All metal. Jewelry."       Objective:  BP 134/80   Pulse 69   Temp 98.5 F (36.9 C) (Oral)   Ht 6\' 3"  (1.905 m)   Wt 251 lb 12.8 oz (114.2 kg)   SpO2 98%   BMI 31.47 kg/m   Physical Exam Vitals  signs reviewed.  Constitutional:      Appearance: Normal appearance.  HENT:     Mouth/Throat:     Mouth: Mucous membranes are moist.     Dentition: Dental abscesses present. No dental caries.     Pharynx: Oropharynx is clear. Uvula midline.     Tonsils: No tonsillar exudate or tonsillar abscesses.   Neurological:     Mental Status: He is alert.  Psychiatric:        Mood and Affect: Mood normal.        Behavior: Behavior normal.        Thought Content: Thought content normal.     Assessment and Plan :  1. Dental abscess - Pt endorses right sided facial pain and swelling x 2 days. HPI and PE suggestive of dental abscess. Plan to start Augmentin. RTC in 3 weeks for recheck and annual exam. - amoxicillin-clavulanate (AUGMENTIN) 875-125 MG tablet; Take 1 tablet by mouth 2 (two) times daily.  Dispense: 20 tablet; Refill: 0  2. Tooth pain - Advised ibuprofen.     Marco CollieWhitney Taliya Mcclard, PA-C  [facility] 03/20/2018 9:05 AM  Please note: Portions of this report may have been transcribed using dragon voice recognition software. Every effort was made to ensure accuracy; however, inadvertent computerized transcription errors may be  present.

## 2018-05-07 ENCOUNTER — Ambulatory Visit (INDEPENDENT_AMBULATORY_CARE_PROVIDER_SITE_OTHER): Payer: Medicare Other | Admitting: Family Medicine

## 2018-05-07 ENCOUNTER — Other Ambulatory Visit: Payer: Self-pay

## 2018-05-07 VITALS — BP 146/95 | HR 80 | Temp 98.4°F | Ht 75.0 in | Wt 262.4 lb

## 2018-05-07 DIAGNOSIS — E781 Pure hyperglyceridemia: Secondary | ICD-10-CM

## 2018-05-07 DIAGNOSIS — Z114 Encounter for screening for human immunodeficiency virus [HIV]: Secondary | ICD-10-CM | POA: Diagnosis not present

## 2018-05-07 DIAGNOSIS — M542 Cervicalgia: Secondary | ICD-10-CM | POA: Diagnosis not present

## 2018-05-07 DIAGNOSIS — Z0001 Encounter for general adult medical examination with abnormal findings: Secondary | ICD-10-CM | POA: Diagnosis not present

## 2018-05-07 DIAGNOSIS — I10 Essential (primary) hypertension: Secondary | ICD-10-CM

## 2018-05-07 DIAGNOSIS — R002 Palpitations: Secondary | ICD-10-CM

## 2018-05-07 DIAGNOSIS — Z113 Encounter for screening for infections with a predominantly sexual mode of transmission: Secondary | ICD-10-CM

## 2018-05-07 DIAGNOSIS — Z1329 Encounter for screening for other suspected endocrine disorder: Secondary | ICD-10-CM

## 2018-05-07 DIAGNOSIS — Z0000 Encounter for general adult medical examination without abnormal findings: Secondary | ICD-10-CM

## 2018-05-07 MED ORDER — LISINOPRIL 10 MG PO TABS: 10.0000 mg | ORAL_TABLET | Freq: Every day | ORAL | 1 refills | Status: DC

## 2018-05-07 NOTE — Progress Notes (Signed)
Chief Complaint  Patient presents with  . Annual Exam    thinks he may be hypertensive, sometimes can tell when bp is elevated    Subjective:  Richard Bautista is a 48 y.o. male here for a health maintenance visit.  Patient is established pt  Pt has a history of hypertension in the past and was on lisinopril but was stopped because the bp improved He states that he has been felling the hypertension a little with symptoms of anxiousness Like he can't calm down inside  He consumes a low sodium diet    He also reports that he has pain in his neck worse on the left side No pain with swallowing but he can press under the chin  States that he has no fevers or chills   Patient Active Problem List   Diagnosis Date Noted  . Pain of right great toe 09/09/2013  . HYPERTRIGLYCERIDEMIA 05/06/2008  . COUGH 04/19/2008  . OBESITY 02/13/2008  . TOBACCO ABUSE 02/13/2008  . HYPERTENSION, BENIGN ESSENTIAL 02/13/2008  . GERD 02/13/2008  . 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 Blind one eye    right   . Detached retina   . Knee injury   . Pneumonia     Past Surgical History:  Procedure Laterality Date  . EYE SURGERY       Outpatient Medications Prior to Visit  Medication Sig Dispense Refill  . amoxicillin-clavulanate (AUGMENTIN) 875-125 MG tablet Take 1 tablet by mouth 2 (two) times daily. (Patient not taking: Reported on 05/07/2018) 20 tablet 0  . cyclobenzaprine (FLEXERIL) 10 MG tablet Take 1 tablet (10 mg total) by mouth at bedtime. (Patient not taking: Reported on 05/07/2018) 10 tablet 0  . ibuprofen (ADVIL,MOTRIN) 600 MG tablet Take 1 tablet (600 mg total) by mouth every 6 (six) hours as needed. (Patient not taking: Reported on 05/07/2018) 30 tablet 0  . traMADol (ULTRAM) 50 MG tablet Take 1 tablet (50 mg total) by mouth every 8 (eight) hours as needed. (Patient not taking: Reported on 03/13/2018) 10 tablet 0   No  facility-administered medications prior to visit.     Allergies  Allergen Reactions  . Benadryl [Diphenhydramine] Other (See Comments)    Causes him to be woozy and not be able to control him self  . Erythromycin     REACTION: faint, chills  . Other Rash    "All metal. Jewelry."       No family history on file.   Health Habits: Dental Exam: up to date Eye Exam: up to date Exercise: 4 times/week on average Current exercise activities: walking/running Diet:   Social History   Socioeconomic History  . Marital status: Married    Spouse name: Not on file  . Number of children: Not on file  . Years of education: Not on file  . Highest education level: Not on file  Occupational History  . Not on file  Social Needs  . Financial resource strain: Not on file  . Food insecurity:    Worry: Not on file    Inability: Not on file  . Transportation needs:    Medical: Not on file    Non-medical: Not on file  Tobacco Use  . Smoking status: Former Smoker    Types: Cigars    Last attempt to quit: 02/18/2018    Years since quitting: 0.2  . Smokeless tobacco: Never Used  Substance and Sexual Activity  .  Alcohol use: Yes    Comment: socially  . Drug use: No  . Sexual activity: Not on file  Lifestyle  . Physical activity:    Days per week: Not on file    Minutes per session: Not on file  . Stress: Not on file  Relationships  . Social connections:    Talks on phone: Not on file    Gets together: Not on file    Attends religious service: Not on file    Active member of club or organization: Not on file    Attends meetings of clubs or organizations: Not on file    Relationship status: Not on file  . Intimate partner violence:    Fear of current or ex partner: Not on file    Emotionally abused: Not on file    Physically abused: Not on file    Forced sexual activity: Not on file  Other Topics Concern  . Not on file  Social History Narrative  . Not on file   Social  History   Substance and Sexual Activity  Alcohol Use Yes   Comment: socially   Social History   Tobacco Use  Smoking Status Former Smoker  . Types: Cigars  . Last attempt to quit: 02/18/2018  . Years since quitting: 0.2  Smokeless Tobacco Never Used   Social History   Substance and Sexual Activity  Drug Use No    GYN: Sexual Health Menstrual status: regular menses LMP: No LMP for male patient. Last pap smear: see HM section History of abnormal pap smears:    Health Maintenance: See under health Maintenance activity for review of completion dates as well. Immunization History  Administered Date(s) Administered  . Td 09/13/2008      Depression Screen-PHQ2/9 Depression screen [facility] 2/9 05/17/2018 03/20/2018 12/17/2017 04/18/2017 12/09/2014  Decreased Interest 0 0 0 0 0  Down, Depressed, Hopeless 0 0 0 0 0  PHQ - 2 Score 0 0 0 0 0       Depression Severity and Treatment Recommendations:  0-4= None  5-9= Mild / Treatment: Support, educate to call if worse; return in one month  10-14= Moderate / Treatment: Support, watchful waiting; Antidepressant or Psycotherapy  15-19= Moderately severe / Treatment: Antidepressant OR Psychotherapy  >= 20 = Major depression, severe / Antidepressant AND Psychotherapy    Review of Systems   ROS  See HPI for ROS as well.  Review of Systems  Constitutional: Negative for activity change, appetite change, chills and fever.  HENT: Negative for congestion, nosebleeds, trouble swallowing and voice change.   NECK PAIN AT THE SIDE OF THE NECK NEAR THE ADAM'S APPLE Respiratory: Negative for cough, shortness of breath and wheezing.   Gastrointestinal: Negative for diarrhea, nausea and vomiting.  Genitourinary: Negative for difficulty urinating, dysuria, flank pain and hematuria.  Musculoskeletal: Negative for back pain, joint swelling and neck pain.  Neurological: Negative for dizziness, speech difficulty, light-headedness and numbness.    See HPI. All other review of systems negative.    Objective:   Vitals:   05/07/18 1451  BP: (!) 146/95  Pulse: 80  Temp: 98.4 F (36.9 C)  TempSrc: Oral  SpO2: 95%  Weight: 262 lb 6.4 oz (119 kg)  Height: '6\' 3"'  (1.905 m)    Body mass index is 32.8 kg/m.  Physical Exam  Physical Exam  Constitutional: Oriented to person, place, and time. Appears well-developed and well-nourished.  HENT:  Head: Normocephalic and atraumatic.  Eyes: Conjunctivae and EOM are  normal.  Cardiovascular: Normal rate, regular rhythm, normal heart sounds and intact distal pulses.  No murmur heard. Pulmonary/Chest: Effort normal and breath sounds normal. No stridor. No respiratory distress. Has no wheezes.  Abdomen: nondistended, normoactive bs, soft, nontender Neurological: Is alert and oriented to person, place, and time.  Skin: Skin is warm. Capillary refill takes less than 2 seconds.  Psychiatric: Has a normal mood and affect. Behavior is normal. Judgment and thought content normal.    ECG-  NSR   Assessment/Plan:   Patient was seen for a health maintenance exam.  Counseled the patient on health maintenance issues. Reviewed her health mainteance schedule and ordered appropriate tests (see orders.) Counseled on regular exercise and weight management. Recommend regular eye exams and dental cleaning.   The following issues were addressed today for health maintenance:   Richard Bautista was seen today for annual exam.  Diagnoses and all orders for this visit:  Medicare annual wellness visit, subsequent  HYPERTENSION, BENIGN ESSENTIAL-  WILL CHECK ECG DISCUSSED TREATMENT WITH ACE INHIBITOR -     Lipid panel -     TSH -     CMP14+EGFR -     EKG 12-Lead -     lisinopril (PRINIVIL,ZESTRIL) 10 MG tablet; Take 1 tablet (10 mg total) by mouth daily.  Screening for thyroid disorder  HYPERTRIGLYCERIDEMIA -     Lipid panel -     TSH -     Comprehensive metabolic panel; Future -     Lipid panel;  Future  Screening for STD (sexually transmitted disease) -     Hepatitis C antibody -     RPR -     Trichomonas vaginalis, RNA -     GC/Chlamydia Probe Amp -     Hepatitis B surface antigen  Screening for HIV (human immunodeficiency virus) -     HIV Antibody (routine testing w rflx)  Palpitations -     EKG 12-Lead  Neck pain-  WILL CHECK [place]  -     US Soft Tissue Head/Neck; Future    Return in about 4 weeks (around 06/04/2018) for hypertension follow up .    Body mass index is 32.8 kg/m.:  Discussed the patient's BMI with patient. The BMI body mass index is 32.8 kg/m.     Future Appointments  Date Time Provider [place]  06/06/2018  3:20 PM Forrest Moron, MD PCP-PCP [facility]    Patient Instructions  Managing Your Hypertension Hypertension is commonly called high blood pressure. This is when the force of your blood pressing against the walls of your arteries is too strong. Arteries are blood vessels that carry blood from your heart throughout your body. Hypertension forces the heart to work harder to pump blood, and may cause the arteries to become narrow or stiff. Having untreated or uncontrolled hypertension can cause heart attack, stroke, kidney disease, and other problems. What are blood pressure readings? A blood pressure reading consists of a higher number over a lower number. Ideally, your blood pressure should be below 120/80. The first ("top") number is called the systolic pressure. It is a measure of the pressure in your arteries as your heart beats. The second ("bottom") number is called the diastolic pressure. It is a measure of the pressure in your arteries as the heart relaxes. What does my blood pressure reading mean? Blood pressure is classified into four stages. Based on your blood pressure reading, your health care provider may use the following stages to determine what type  of treatment you need, if any. Systolic pressure and diastolic pressure are  measured in a unit called mm Hg. Normal  Systolic pressure: below 295.  Diastolic pressure: below 80. Elevated  Systolic pressure: 284-132.  Diastolic pressure: below 80. Hypertension stage 1  Systolic pressure: 440-102.  Diastolic pressure: 72-53. Hypertension stage 2  Systolic pressure: 664 or above.  Diastolic pressure: 90 or above. What health risks are associated with hypertension? Managing your hypertension is an important responsibility. Uncontrolled hypertension can lead to:  A heart attack.  A stroke.  A weakened blood vessel (aneurysm).  Heart failure.  Kidney damage.  Eye damage.  Metabolic syndrome.  Memory and concentration problems. What changes can I make to manage my hypertension? Hypertension can be managed by making lifestyle changes and possibly by taking medicines. Your health care provider will help you make a plan to bring your blood pressure within a normal range. Eating and drinking   Eat a diet that is high in fiber and potassium, and low in salt (sodium), added sugar, and fat. An example eating plan is called the DASH (Dietary Approaches to Stop Hypertension) diet. To eat this way: ? Eat plenty of fresh fruits and vegetables. Try to fill half of your plate at each meal with fruits and vegetables. ? Eat whole grains, such as whole wheat pasta, brown rice, or whole grain bread. Fill about one quarter of your plate with whole grains. ? Eat low-fat diary products. ? Avoid fatty cuts of meat, processed or cured meats, and poultry with skin. Fill about one quarter of your plate with lean proteins such as fish, chicken without skin, beans, eggs, and tofu. ? Avoid premade and processed foods. These tend to be higher in sodium, added sugar, and fat.  Reduce your daily sodium intake. Most people with hypertension should eat less than 1,500 mg of sodium a day.  Limit alcohol intake to no more than 1 drink a day for nonpregnant women and 2 drinks a  day for men. One drink equals 12 oz of beer, 5 oz of wine, or 1 oz of hard liquor. Lifestyle  Work with your health care provider to maintain a healthy body weight, or to lose weight. Ask what an ideal weight is for you.  Get at least 30 minutes of exercise that causes your heart to beat faster (aerobic exercise) most days of the week. Activities may include walking, swimming, or biking.  Include exercise to strengthen your muscles (resistance exercise), such as weight lifting, as part of your weekly exercise routine. Try to do these types of exercises for 30 minutes at least 3 days a week.  Do not use any products that contain nicotine or tobacco, such as cigarettes and e-cigarettes. If you need help quitting, ask your health care provider.  Control any long-term (chronic) conditions you have, such as high cholesterol or diabetes. Monitoring  Monitor your blood pressure at home as told by your health care provider. Your personal target blood pressure may vary depending on your medical conditions, your age, and other factors.  Have your blood pressure checked regularly, as often as told by your health care provider. Working with your health care provider  Review all the medicines you take with your health care provider because there may be side effects or interactions.  Talk with your health care provider about your diet, exercise habits, and other lifestyle factors that may be contributing to hypertension.  Visit your health care provider regularly. Your health care provider  can help you create and adjust your plan for managing hypertension. Will I need medicine to control my blood pressure? Your health care provider may prescribe medicine if lifestyle changes are not enough to get your blood pressure under control, and if:  Your systolic blood pressure is 130 or higher.  Your diastolic blood pressure is 80 or higher. Take medicines only as told by your health care provider. Follow  the directions carefully. Blood pressure medicines must be taken as prescribed. The medicine does not work as well when you skip doses. Skipping doses also puts you at risk for problems. Contact a health care provider if:  You think you are having a reaction to medicines you have taken.  You have repeated (recurrent) headaches.  You feel dizzy.  You have swelling in your ankles.  You have trouble with your vision. Get help right away if:  You develop a severe headache or confusion.  You have unusual weakness or numbness, or you feel faint.  You have severe pain in your chest or abdomen.  You vomit repeatedly.  You have trouble breathing. Summary  Hypertension is when the force of blood pumping through your arteries is too strong. If this condition is not controlled, it may put you at risk for serious complications.  Your personal target blood pressure may vary depending on your medical conditions, your age, and other factors. For most people, a normal blood pressure is less than 120/80.  Hypertension is managed by lifestyle changes, medicines, or both. Lifestyle changes include weight loss, eating a healthy, low-sodium diet, exercising more, and limiting alcohol. This information is not intended to replace advice given to you by your health care provider. Make sure you discuss any questions you have with your health care provider. Document Released: 12/05/2011 Document Revised: 02/08/2016 Document Reviewed: 02/08/2016 Elsevier Interactive Patient Education  2019 [employer].

## 2018-05-07 NOTE — Patient Instructions (Signed)
Managing Your Hypertension  Hypertension is commonly called high blood pressure. This is when the force of your blood pressing against the walls of your arteries is too strong. Arteries are blood vessels that carry blood from your heart throughout your body. Hypertension forces the heart to work harder to pump blood, and may cause the arteries to become narrow or stiff. Having untreated or uncontrolled hypertension can cause heart attack, stroke, kidney disease, and other problems.  What are blood pressure readings?  A blood pressure reading consists of a higher number over a lower number. Ideally, your blood pressure should be below 120/80. The first ("top") number is called the systolic pressure. It is a measure of the pressure in your arteries as your heart beats. The second ("bottom") number is called the diastolic pressure. It is a measure of the pressure in your arteries as the heart relaxes.  What does my blood pressure reading mean?  Blood pressure is classified into four stages. Based on your blood pressure reading, your health care provider may use the following stages to determine what type of treatment you need, if any. Systolic pressure and diastolic pressure are measured in a unit called mm Hg.  Normal   Systolic pressure: below 120.   Diastolic pressure: below 80.  Elevated   Systolic pressure: 120-129.   Diastolic pressure: below 80.  Hypertension stage 1   Systolic pressure: 130-139.   Diastolic pressure: 80-89.  Hypertension stage 2   Systolic pressure: 140 or above.   Diastolic pressure: 90 or above.  What health risks are associated with hypertension?  Managing your hypertension is an important responsibility. Uncontrolled hypertension can lead to:   A heart attack.   A stroke.   A weakened blood vessel (aneurysm).   Heart failure.   Kidney damage.   Eye damage.   Metabolic syndrome.   Memory and concentration problems.  What changes can I make to manage my  hypertension?  Hypertension can be managed by making lifestyle changes and possibly by taking medicines. Your health care provider will help you make a plan to bring your blood pressure within a normal range.  Eating and drinking     Eat a diet that is high in fiber and potassium, and low in salt (sodium), added sugar, and fat. An example eating plan is called the DASH (Dietary Approaches to Stop Hypertension) diet. To eat this way:  ? Eat plenty of fresh fruits and vegetables. Try to fill half of your plate at each meal with fruits and vegetables.  ? Eat whole grains, such as whole wheat pasta, brown rice, or whole grain bread. Fill about one quarter of your plate with whole grains.  ? Eat low-fat diary products.  ? Avoid fatty cuts of meat, processed or cured meats, and poultry with skin. Fill about one quarter of your plate with lean proteins such as fish, chicken without skin, beans, eggs, and tofu.  ? Avoid premade and processed foods. These tend to be higher in sodium, added sugar, and fat.   Reduce your daily sodium intake. Most people with hypertension should eat less than 1,500 mg of sodium a day.   Limit alcohol intake to no more than 1 drink a day for nonpregnant women and 2 drinks a day for men. One drink equals 12 oz of beer, 5 oz of wine, or 1 oz of hard liquor.  Lifestyle   Work with your health care provider to maintain a healthy body weight, or to lose   weight. Ask what an ideal weight is for you.   Get at least 30 minutes of exercise that causes your heart to beat faster (aerobic exercise) most days of the week. Activities may include walking, swimming, or biking.   Include exercise to strengthen your muscles (resistance exercise), such as weight lifting, as part of your weekly exercise routine. Try to do these types of exercises for 30 minutes at least 3 days a week.   Do not use any products that contain nicotine or tobacco, such as cigarettes and e-cigarettes. If you need help quitting,  ask your health care provider.   Control any long-term (chronic) conditions you have, such as high cholesterol or diabetes.  Monitoring   Monitor your blood pressure at home as told by your health care provider. Your personal target blood pressure may vary depending on your medical conditions, your age, and other factors.   Have your blood pressure checked regularly, as often as told by your health care provider.  Working with your health care provider   Review all the medicines you take with your health care provider because there may be side effects or interactions.   Talk with your health care provider about your diet, exercise habits, and other lifestyle factors that may be contributing to hypertension.   Visit your health care provider regularly. Your health care provider can help you create and adjust your plan for managing hypertension.  Will I need medicine to control my blood pressure?  Your health care provider may prescribe medicine if lifestyle changes are not enough to get your blood pressure under control, and if:   Your systolic blood pressure is 130 or higher.   Your diastolic blood pressure is 80 or higher.  Take medicines only as told by your health care provider. Follow the directions carefully. Blood pressure medicines must be taken as prescribed. The medicine does not work as well when you skip doses. Skipping doses also puts you at risk for problems.  Contact a health care provider if:   You think you are having a reaction to medicines you have taken.   You have repeated (recurrent) headaches.   You feel dizzy.   You have swelling in your ankles.   You have trouble with your vision.  Get help right away if:   You develop a severe headache or confusion.   You have unusual weakness or numbness, or you feel faint.   You have severe pain in your chest or abdomen.   You vomit repeatedly.   You have trouble breathing.  Summary   Hypertension is when the force of blood pumping  through your arteries is too strong. If this condition is not controlled, it may put you at risk for serious complications.   Your personal target blood pressure may vary depending on your medical conditions, your age, and other factors. For most people, a normal blood pressure is less than 120/80.   Hypertension is managed by lifestyle changes, medicines, or both. Lifestyle changes include weight loss, eating a healthy, low-sodium diet, exercising more, and limiting alcohol.  This information is not intended to replace advice given to you by your health care provider. Make sure you discuss any questions you have with your health care provider.  Document Released: 12/05/2011 Document Revised: 02/08/2016 Document Reviewed: 02/08/2016  Elsevier Interactive Patient Education  2019 [employer]

## 2018-05-08 LAB — CMP14+EGFR
ALT: 14 IU/L (ref 0–44)
AST: 41 IU/L — ABNORMAL HIGH (ref 0–40)
Albumin/Globulin Ratio: 1.6 (ref 1.2–2.2)
Albumin: 4.5 g/dL (ref 4.0–5.0)
Alkaline Phosphatase: 64 IU/L (ref 39–117)
BUN/Creatinine Ratio: 13 (ref 9–20)
BUN: 17 mg/dL (ref 6–24)
Bilirubin Total: <0.2 mg/dL (ref 0.0–1.2)
CO2: 21 mmol/L (ref 20–29)
Calcium: 9.6 mg/dL (ref 8.7–10.2)
Chloride: 102 mmol/L (ref 96–106)
Creatinine, Ser: 1.28 mg/dL — ABNORMAL HIGH (ref 0.76–1.27)
GFR calc Af Amer: 77 mL/min/{1.73_m2} (ref >59)
GFR calc non Af Amer: 66 mL/min/{1.73_m2} (ref >59)
Globulin, Total: 2.9 g/dL (ref 1.5–4.5)
Glucose: 88 mg/dL (ref 65–99)
Potassium: 4.3 mmol/L (ref 3.5–5.2)
Sodium: 140 mmol/L (ref 134–144)
Total Protein: 7.4 g/dL (ref 6.0–8.5)

## 2018-05-08 LAB — LIPID PANEL
Chol/HDL Ratio: 5.4 ratio — ABNORMAL HIGH (ref 0.0–5.0)
Cholesterol, Total: 228 mg/dL — ABNORMAL HIGH (ref 100–199)
HDL: 42 mg/dL (ref >39)
Triglycerides: 474 mg/dL — ABNORMAL HIGH (ref 0–149)

## 2018-05-08 LAB — HEPATITIS C ANTIBODY: Hep C Virus Ab: <0.1 s/co ratio (ref 0.0–0.9)

## 2018-05-08 LAB — RPR: RPR Ser Ql: NONREACTIVE

## 2018-05-08 LAB — TSH: TSH: 2.41 u[IU]/mL (ref 0.450–4.500)

## 2018-05-08 LAB — HIV ANTIBODY (ROUTINE TESTING W REFLEX): HIV Screen 4th Generation wRfx: NONREACTIVE

## 2018-05-08 LAB — HEPATITIS B SURFACE ANTIGEN: Hepatitis B Surface Ag: NEGATIVE

## 2018-05-10 LAB — GC/CHLAMYDIA PROBE AMP
Chlamydia trachomatis, NAA: NEGATIVE
Neisseria gonorrhoeae by PCR: NEGATIVE

## 2018-05-14 LAB — TRICHOMONAS VAGINALIS, PROBE AMP: Trich vag by NAA: NEGATIVE

## 2018-05-17 ENCOUNTER — Other Ambulatory Visit: Payer: Self-pay

## 2018-05-17 ENCOUNTER — Encounter: Payer: Self-pay | Admitting: Family Medicine

## 2018-05-17 ENCOUNTER — Ambulatory Visit (INDEPENDENT_AMBULATORY_CARE_PROVIDER_SITE_OTHER): Payer: Medicare Other

## 2018-05-17 ENCOUNTER — Ambulatory Visit (INDEPENDENT_AMBULATORY_CARE_PROVIDER_SITE_OTHER): Payer: Medicare Other | Admitting: Family Medicine

## 2018-05-17 VITALS — BP 150/86 | HR 73 | Temp 98.8°F | Resp 16 | Ht 74.0 in | Wt 261.2 lb

## 2018-05-17 DIAGNOSIS — R12 Heartburn: Secondary | ICD-10-CM

## 2018-05-17 DIAGNOSIS — R05 Cough: Secondary | ICD-10-CM | POA: Diagnosis not present

## 2018-05-17 DIAGNOSIS — R059 Cough, unspecified: Secondary | ICD-10-CM

## 2018-05-17 DIAGNOSIS — J069 Acute upper respiratory infection, unspecified: Secondary | ICD-10-CM

## 2018-05-17 MED ORDER — BENZONATATE 100 MG PO CAPS: 100.0000 mg | ORAL_CAPSULE | Freq: Three times a day (TID) | ORAL | 0 refills | Status: DC | PRN

## 2018-05-17 NOTE — Progress Notes (Signed)
Subjective:    Patient ID: Richard Bautista, male    DOB: 01/31/1971, 48 y.o.   MRN: [id number]  HPI Richard MutterHassan C Bautista is a 48 y.o. male Presents today for: Chief Complaint  Patient presents with  . Chest congestion    x 2 days, "chest tightness" mucus yellowish  . Cough    x 2 days, "hurts chest when coughing, a little hard to breathe   Cough: Started 2 days ago, yellow phlegm at times. Chest congestion. Concerned as had PNA in past.  Some runny nose and nasal congestion as well. Chest feels tight with congestion. No radiating chest pain. Just tight with congestion.  No measured fever, but warm at times. On hx of asthma/copd known.   Tx:  mucinex D, pantoprazole (taking wife's rx for heartburn) Has not taken lisinopril yet today.   Heartburn with certain foods only. Hx of GERD, not PUD. As needed meds.   Sick contacts with cough recently.  No tobacco since nov/dec 2019.   Patient Active Problem List   Diagnosis Date Noted  . Pain of right great toe 09/09/2013  . HYPERTRIGLYCERIDEMIA 05/06/2008  . COUGH 04/19/2008  . OBESITY 02/13/2008  . TOBACCO ABUSE 02/13/2008  . HYPERTENSION, BENIGN ESSENTIAL 02/13/2008  . GERD 02/13/2008  . RETINAL DETACHMENT, RIGHT EYE, HX OF 08/25/2007   Past Medical History:  Diagnosis Date  . Blind one eye    right   . Detached retina   . Knee injury   . Pneumonia    Past Surgical History:  Procedure Laterality Date  . EYE SURGERY     Allergies  Allergen Reactions  . Benadryl [Diphenhydramine] Other (See Comments)    Causes him to be woozy and not be able to control him self  . Erythromycin     REACTION: faint, chills  . Other Rash    "All metal. Jewelry."     Prior to Admission medications   Medication Sig Start Date End Date Taking? Authorizing Provider  lisinopril (PRINIVIL,ZESTRIL) 10 MG tablet Take 1 tablet (10 mg total) by mouth daily. 05/07/18  Yes [name], [name], MD   Social History   Socioeconomic History  . Marital  status: Married    Spouse name: Not on file  . Number of children: Not on file  . Years of education: Not on file  . Highest education level: Not on file  Occupational History  . Not on file  Social Needs  . Financial resource strain: Not on file  . Food insecurity:    Worry: Not on file    Inability: Not on file  . Transportation needs:    Medical: Not on file    Non-medical: Not on file  Tobacco Use  . Smoking status: Former Smoker    Types: Cigars    Last attempt to quit: 02/18/2018    Years since quitting: 0.2  . Smokeless tobacco: Never Used  Substance and Sexual Activity  . Alcohol use: Yes    Comment: socially  . Drug use: No  . Sexual activity: Not on file  Lifestyle  . Physical activity:    Days per week: Not on file    Minutes per session: Not on file  . Stress: Not on file  Relationships  . Social connections:    Talks on phone: Not on file    Gets together: Not on file    Attends religious service: Not on file    Active member of club or organization: Not  on file    Attends meetings of clubs or organizations: Not on file    Relationship status: Not on file  . Intimate partner violence:    Fear of current or ex partner: Not on file    Emotionally abused: Not on file    Physically abused: Not on file    Forced sexual activity: Not on file  Other Topics Concern  . Not on file  Social History Narrative  . Not on file    Review of Systems per HPI.     Objective:   Physical Exam Vitals signs reviewed.  Constitutional:      Appearance: He is well-developed.  HENT:     Head: Normocephalic and atraumatic.     Right Ear: Tympanic membrane, ear canal and external ear normal.     Left Ear: Tympanic membrane, ear canal and external ear normal.     Nose: No rhinorrhea.     Mouth/Throat:     Pharynx: No oropharyngeal exudate or posterior oropharyngeal erythema.  Eyes:     Conjunctiva/sclera: Conjunctivae normal.     Pupils: Pupils are equal, round, and  reactive to light.  Neck:     Musculoskeletal: Neck supple.  Cardiovascular:     Rate and Rhythm: Normal rate and regular rhythm.     Heart sounds: Normal heart sounds. No murmur.  Pulmonary:     Effort: Pulmonary effort is normal.     Breath sounds: Normal breath sounds. No wheezing, rhonchi or rales.  Abdominal:     Palpations: Abdomen is soft.     Tenderness: There is no abdominal tenderness.  Lymphadenopathy:     Cervical: No cervical adenopathy.  Skin:    General: Skin is warm and dry.     Findings: No rash.  Neurological:     Mental Status: He is alert and oriented to person, place, and time.  Psychiatric:        Behavior: Behavior normal.    Vitals:   05/17/18 1219 05/17/18 1250  BP: (!) 165/78 (!) 150/86  Pulse: 73   Resp: 16   Temp: 98.8 F (37.1 C)   TempSrc: Oral   SpO2: 96%   Weight: 261 lb 3.2 oz (118.5 kg)   Height: 6\' 2"  (1.88 m)   PF: 450 L/min    Dg Chest 2 View  Result Date: 05/17/2018 CLINICAL DATA:  Cough.  Congestion. EXAM: CHEST - 2 VIEW COMPARISON:  05/04/2017 FINDINGS: Both lungs are clear. Heart and mediastinum are within normal limits. Possible small hiatal hernia. Trachea is midline. No pleural effusions. No acute bone abnormality. IMPRESSION: No active cardiopulmonary disease. Possible hiatal hernia. Electronically Signed   By: Richarda Overlie M.D.   On: 05/17/2018 13:29       Assessment & Plan:   Richard Bautista is a 48 y.o. male Cough - Plan: DG Chest 2 View, benzonatate (TESSALON) 100 MG capsule  Heartburn  Acute upper respiratory infection  Suspected viral illness, reassuring chest x-ray.  Reassuring exam.  -Tessalon Perles for cough, Mucinex over-the-counter if needed for congestion.  RTC/ER precautions given.  Handout given  Trigger food avoidance discussed for heartburn. hadnout given. otc omeprazole if needed, rtc precautions if persistent need.   Meds ordered this encounter  Medications  . benzonatate (TESSALON) 100 MG capsule      Sig: Take 1 capsule (100 mg total) by mouth 3 (three) times daily as needed for cough.    Dispense:  20 capsule    Refill:  0   Patient Instructions   Cough is likely due to viral illness.  I do not see any concerns on x-ray.  Can try Mucinex over-the-counter as needed for cough and congestion, or Tessalon Perles were also prescribed.  Fluids and rest.  Recheck if worsening   Try to avoid foods that can cause worsen heartburn. If needed, can use over the counter omeprazole for flair in symptoms. If that is needed daily for more than a few weeks - please return to discuss further.   Return to the clinic or go to the nearest emergency room if any of your symptoms worsen or new symptoms occur.   Upper Respiratory Infection, Adult An upper respiratory infection (URI) affects the nose, throat, and upper air passages. URIs are caused by germs (viruses). The most common type of URI is often called "the common cold." Medicines cannot cure URIs, but you can do things at home to relieve your symptoms. URIs usually get better within 7-10 days. Follow these instructions at home: Activity  Rest as needed.  If you have a fever, stay home from work or school until your fever is gone, or until your doctor says you may return to work or school. ? You should stay home until you cannot spread the infection anymore (you are not contagious). ? Your doctor may have you wear a face mask so you have less risk of spreading the infection. Relieving symptoms  Gargle with a salt-water mixture 3-4 times a day or as needed. To make a salt-water mixture, completely dissolve -1 tsp of salt in 1 cup of warm water.  Use a cool-mist humidifier to add moisture to the air. This can help you breathe more easily. Eating and drinking   Drink enough fluid to keep your pee (urine) pale yellow.  Eat soups and other clear broths. General instructions   Take over-the-counter and prescription medicines only as told by  your doctor. These include cold medicines, fever reducers, and cough suppressants.  Do not use any products that contain nicotine or tobacco. These include cigarettes and e-cigarettes. If you need help quitting, ask your doctor.  Avoid being where people are smoking (avoid secondhand smoke).  Make sure you get regular shots and get the flu shot every year.  Keep all follow-up visits as told by your doctor. This is important. How to avoid spreading infection to others   Wash your hands often with soap and water. If you do not have soap and water, use hand sanitizer.  Avoid touching your mouth, face, eyes, or nose.  Cough or sneeze into a tissue or your sleeve or elbow. Do not cough or sneeze into your hand or into the air. Contact a doctor if:  You are getting worse, not better.  You have any of these: ? A fever. ? Chills. ? Brown or red mucus in your nose. ? Yellow or brown fluid (discharge)coming from your nose. ? Pain in your face, especially when you bend forward. ? Swollen neck glands. ? Pain with swallowing. ? White areas in the back of your throat. Get help right away if:  You have shortness of breath that gets worse.  You have very bad or constant: ? Headache. ? Ear pain. ? Pain in your forehead, behind your eyes, and over your cheekbones (sinus pain). ? Chest pain.  You have long-lasting (chronic) lung disease along with any of these: ? Wheezing. ? Long-lasting cough. ? Coughing up blood. ? A change in your usual mucus.  You have a stiff neck.  You have changes in your: ? Vision. ? Hearing. ? Thinking. ? Mood. Summary  An upper respiratory infection (URI) is caused by a germ called a virus. The most common type of URI is often called "the common cold."  URIs usually get better within 7-10 days.  Take over-the-counter and prescription medicines only as told by your doctor. This information is not intended to replace advice given to you by your health  care provider. Make sure you discuss any questions you have with your health care provider. Document Released: 08/29/2007 Document Revised: 11/02/2016 Document Reviewed: 11/02/2016 Elsevier Interactive Patient Education  2019 Elsevier Inc.  Cough, Adult  Coughing is a reflex that clears your throat and your airways. Coughing helps to heal and protect your lungs. It is normal to cough occasionally, but a cough that happens with other symptoms or lasts a long time may be a sign of a condition that needs treatment. A cough may last only 2-3 weeks (acute), or it may last longer than 8 weeks (chronic). What are the causes? Coughing is commonly caused by:  Breathing in substances that irritate your lungs.  A viral or bacterial respiratory infection.  Allergies.  Asthma.  Postnasal drip.  Smoking.  Acid backing up from the stomach into the esophagus (gastroesophageal reflux).  Certain medicines.  Chronic lung problems, including COPD (or rarely, lung cancer).  Other medical conditions such as heart failure. Follow these instructions at home: Pay attention to any changes in your symptoms. Take these actions to help with your discomfort:  Take medicines only as told by your health care provider. ? If you were prescribed an antibiotic medicine, take it as told by your health care provider. Do not stop taking the antibiotic even if you start to feel better. ? Talk with your health care provider before you take a cough suppressant medicine.  Drink enough fluid to keep your urine clear or pale yellow.  If the air is dry, use a cold steam vaporizer or humidifier in your bedroom or your home to help loosen secretions.  Avoid anything that causes you to cough at work or at home.  If your cough is worse at night, try sleeping in a semi-upright position.  Avoid cigarette smoke. If you smoke, quit smoking. If you need help quitting, ask your health care provider.  Avoid caffeine.  Avoid  alcohol.  Rest as needed. Contact a health care provider if:  You have new symptoms.  You cough up pus.  Your cough does not get better after 2-3 weeks, or your cough gets worse.  You cannot control your cough with suppressant medicines and you are losing sleep.  You develop pain that is getting worse or pain that is not controlled with pain medicines.  You have a fever.  You have unexplained weight loss.  You have night sweats. Get help right away if:  You cough up blood.  You have difficulty breathing.  Your heartbeat is very fast. This information is not intended to replace advice given to you by your health care provider. Make sure you discuss any questions you have with your health care provider. Document Released: 09/08/2010 Document Revised: 08/18/2015 Document Reviewed: 05/19/2014 Elsevier Interactive Patient Education  [employer].    If you have lab work done today you will be contacted with your lab results within the next 2 weeks.  If you have not heard from [place] then please contact us. The fastest way to  get your results is to register for My Chart.   IF you received an x-ray today, you will receive an invoice from [facility]. Please contact [facility] at [phone] with questions or concerns regarding your invoice.   IF you received labwork today, you will receive an invoice from [place]. Please contact [facility] at [phone] with questions or concerns regarding your invoice.   Our billing staff will not be able to assist you with questions regarding bills from these companies.  You will be contacted with the lab results as soon as they are available. The fastest way to get your results is to activate your My Chart account. Instructions are located on the last page of this paperwork. If you have not heard from [place] regarding the results in 2 weeks, please contact this office.      Signed,   [name], MD Primary Care at  [facility].  05/17/18 1:46 PM

## 2018-05-17 NOTE — Patient Instructions (Addendum)
Cough is likely due to viral illness.  I do not see any concerns on x-ray.  Can try Mucinex over-the-counter as needed for cough and congestion, or Tessalon Perles were also prescribed.  Fluids and rest.  Recheck if worsening  Try to avoid foods that can cause worsen heartburn. If needed, can use over the counter omeprazole for flair in symptoms. If that is needed daily for more than a few weeks - please return to discuss further.   Return to the clinic or go to the nearest emergency room if any of your symptoms worsen or new symptoms occur.   Food Choices for Gastroesophageal Reflux Disease, Adult When you have gastroesophageal reflux disease (GERD), the foods you eat and your eating habits are very important. Choosing the right foods can help ease your discomfort. Think about working with a nutrition specialist (dietitian) to help you make good choices. What are tips for following this plan?  Meals  Choose healthy foods that are low in fat, such as fruits, vegetables, whole grains, low-fat dairy products, and lean meat, fish, and poultry.  Eat small meals often instead of 3 large meals a day. Eat your meals slowly, and in a place where you are relaxed. Avoid bending over or lying down until 2-3 hours after eating.  Avoid eating meals 2-3 hours before bed.  Avoid drinking a lot of liquid with meals.  Cook foods using methods other than frying. Bake, grill, or broil food instead.  Avoid or limit: ? Chocolate. ? Peppermint or spearmint. ? Alcohol. ? Pepper. ? Black and decaffeinated coffee. ? Black and decaffeinated tea. ? Bubbly (carbonated) soft drinks. ? Caffeinated energy drinks and soft drinks.  Limit high-fat foods such as: ? Fatty meat or fried foods. ? Whole milk, cream, butter, or ice cream. ? Nuts and nut butters. ? Pastries, donuts, and sweets made with butter or shortening.  Avoid foods that cause symptoms. These foods may be different for everyone. Common foods that  cause symptoms include: ? Tomatoes. ? Oranges, lemons, and limes. ? Peppers. ? Spicy food. ? Onions and garlic. ? Vinegar. Lifestyle  Maintain a healthy weight. Ask your doctor what weight is healthy for you. If you need to lose weight, work with your doctor to do so safely.  Exercise for at least 30 minutes for 5 or more days each week, or as told by your doctor.  Wear loose-fitting clothes.  Do not smoke. If you need help quitting, ask your doctor.  Sleep with the head of your bed higher than your feet. Use a wedge under the mattress or blocks under the bed frame to raise the head of the bed. Summary  When you have gastroesophageal reflux disease (GERD), food and lifestyle choices are very important in easing your symptoms.  Eat small meals often instead of 3 large meals a day. Eat your meals slowly, and in a place where you are relaxed.  Limit high-fat foods such as fatty meat or fried foods.  Avoid bending over or lying down until 2-3 hours after eating.  Avoid peppermint and spearmint, caffeine, alcohol, and chocolate. This information is not intended to replace advice given to you by your health care provider. Make sure you discuss any questions you have with your health care provider. Document Released: 09/11/2011 Document Revised: 04/17/2016 Document Reviewed: 04/17/2016 Elsevier Interactive Patient Education  2019 Elsevier Inc.    Upper Respiratory Infection, Adult An upper respiratory infection (URI) affects the nose, throat, and upper air passages. URIs  are caused by germs (viruses). The most common type of URI is often called "the common cold." Medicines cannot cure URIs, but you can do things at home to relieve your symptoms. URIs usually get better within 7-10 days. Follow these instructions at home: Activity  Rest as needed.  If you have a fever, stay home from work or school until your fever is gone, or until your doctor says you may return to work or  school. ? You should stay home until you cannot spread the infection anymore (you are not contagious). ? Your doctor may have you wear a face mask so you have less risk of spreading the infection. Relieving symptoms  Gargle with a salt-water mixture 3-4 times a day or as needed. To make a salt-water mixture, completely dissolve -1 tsp of salt in 1 cup of warm water.  Use a cool-mist humidifier to add moisture to the air. This can help you breathe more easily. Eating and drinking   Drink enough fluid to keep your pee (urine) pale yellow.  Eat soups and other clear broths. General instructions   Take over-the-counter and prescription medicines only as told by your doctor. These include cold medicines, fever reducers, and cough suppressants.  Do not use any products that contain nicotine or tobacco. These include cigarettes and e-cigarettes. If you need help quitting, ask your doctor.  Avoid being where people are smoking (avoid secondhand smoke).  Make sure you get regular shots and get the flu shot every year.  Keep all follow-up visits as told by your doctor. This is important. How to avoid spreading infection to others   Wash your hands often with soap and water. If you do not have soap and water, use hand sanitizer.  Avoid touching your mouth, face, eyes, or nose.  Cough or sneeze into a tissue or your sleeve or elbow. Do not cough or sneeze into your hand or into the air. Contact a doctor if:  You are getting worse, not better.  You have any of these: ? A fever. ? Chills. ? Brown or red mucus in your nose. ? Yellow or brown fluid (discharge)coming from your nose. ? Pain in your face, especially when you bend forward. ? Swollen neck glands. ? Pain with swallowing. ? White areas in the back of your throat. Get help right away if:  You have shortness of breath that gets worse.  You have very bad or constant: ? Headache. ? Ear pain. ? Pain in your forehead,  behind your eyes, and over your cheekbones (sinus pain). ? Chest pain.  You have long-lasting (chronic) lung disease along with any of these: ? Wheezing. ? Long-lasting cough. ? Coughing up blood. ? A change in your usual mucus.  You have a stiff neck.  You have changes in your: ? Vision. ? Hearing. ? Thinking. ? Mood. Summary  An upper respiratory infection (URI) is caused by a germ called a virus. The most common type of URI is often called "the common cold."  URIs usually get better within 7-10 days.  Take over-the-counter and prescription medicines only as told by your doctor. This information is not intended to replace advice given to you by your health care provider. Make sure you discuss any questions you have with your health care provider. Document Released: 08/29/2007 Document Revised: 11/02/2016 Document Reviewed: 11/02/2016 Elsevier Interactive Patient Education  2019 Elsevier Inc.  Cough, Adult  Coughing is a reflex that clears your throat and your airways. Coughing helps  to heal and protect your lungs. It is normal to cough occasionally, but a cough that happens with other symptoms or lasts a long time may be a sign of a condition that needs treatment. A cough may last only 2-3 weeks (acute), or it may last longer than 8 weeks (chronic). What are the causes? Coughing is commonly caused by:  Breathing in substances that irritate your lungs.  A viral or bacterial respiratory infection.  Allergies.  Asthma.  Postnasal drip.  Smoking.  Acid backing up from the stomach into the esophagus (gastroesophageal reflux).  Certain medicines.  Chronic lung problems, including COPD (or rarely, lung cancer).  Other medical conditions such as heart failure. Follow these instructions at home: Pay attention to any changes in your symptoms. Take these actions to help with your discomfort:  Take medicines only as told by your health care provider. ? If you were  prescribed an antibiotic medicine, take it as told by your health care provider. Do not stop taking the antibiotic even if you start to feel better. ? Talk with your health care provider before you take a cough suppressant medicine.  Drink enough fluid to keep your urine clear or pale yellow.  If the air is dry, use a cold steam vaporizer or humidifier in your bedroom or your home to help loosen secretions.  Avoid anything that causes you to cough at work or at home.  If your cough is worse at night, try sleeping in a semi-upright position.  Avoid cigarette smoke. If you smoke, quit smoking. If you need help quitting, ask your health care provider.  Avoid caffeine.  Avoid alcohol.  Rest as needed. Contact a health care provider if:  You have new symptoms.  You cough up pus.  Your cough does not get better after 2-3 weeks, or your cough gets worse.  You cannot control your cough with suppressant medicines and you are losing sleep.  You develop pain that is getting worse or pain that is not controlled with pain medicines.  You have a fever.  You have unexplained weight loss.  You have night sweats. Get help right away if:  You cough up blood.  You have difficulty breathing.  Your heartbeat is very fast. This information is not intended to replace advice given to you by your health care provider. Make sure you discuss any questions you have with your health care provider. Document Released: 09/08/2010 Document Revised: 08/18/2015 Document Reviewed: 05/19/2014 Elsevier Interactive Patient Education  [employer].    If you have lab work done today you will be contacted with your lab results within the next 2 weeks.  If you have not heard from [place] then please contact us. The fastest way to get your results is to register for My Chart.   IF you received an x-ray today, you will receive an invoice from [facility]. Please contact [facility] at  [phone] with questions or concerns regarding your invoice.   IF you received labwork today, you will receive an invoice from [place]. Please contact [facility] at [phone] with questions or concerns regarding your invoice.   Our billing staff will not be able to assist you with questions regarding bills from these companies.  You will be contacted with the lab results as soon as they are available. The fastest way to get your results is to activate your My Chart account. Instructions are located on the last page of this paperwork. If you have not heard from [place]  regarding the results in 2 weeks, please contact this office.

## 2018-06-06 ENCOUNTER — Other Ambulatory Visit: Payer: Self-pay

## 2018-06-06 ENCOUNTER — Ambulatory Visit (INDEPENDENT_AMBULATORY_CARE_PROVIDER_SITE_OTHER): Payer: Medicare Other | Admitting: Family Medicine

## 2018-06-06 ENCOUNTER — Encounter: Payer: Self-pay | Admitting: Family Medicine

## 2018-06-06 VITALS — BP 136/80 | HR 83 | Temp 98.5°F | Resp 18 | Ht 74.0 in | Wt 257.0 lb

## 2018-06-06 DIAGNOSIS — R05 Cough: Secondary | ICD-10-CM | POA: Diagnosis not present

## 2018-06-06 DIAGNOSIS — I10 Essential (primary) hypertension: Secondary | ICD-10-CM

## 2018-06-06 DIAGNOSIS — R059 Cough, unspecified: Secondary | ICD-10-CM

## 2018-06-06 NOTE — Patient Instructions (Addendum)
   If you have lab work done today you will be contacted with your lab results within the next 2 weeks.  If you have not heard from us then please contact us. The fastest way to get your results is to register for My Chart.   IF you received an x-ray today, you will receive an invoice from [facility]. Please contact [facility] at [phone] with questions or concerns regarding your invoice.   IF you received labwork today, you will receive an invoice from [facility]. Please contact [facility] at [phone] with questions or concerns regarding your invoice.   Our billing staff will not be able to assist you with questions regarding bills from these companies.  You will be contacted with the lab results as soon as they are available. The fastest way to get your results is to activate your My Chart account. Instructions are located on the last page of this paperwork. If you have not heard from us regarding the results in 2 weeks, please contact this office.     Managing Your Hypertension Hypertension is commonly called high blood pressure. This is when the force of your blood pressing against the walls of your arteries is too strong. Arteries are blood vessels that carry blood from your heart throughout your body. Hypertension forces the heart to work harder to pump blood, and may cause the arteries to become narrow or stiff. Having untreated or uncontrolled hypertension can cause heart attack, stroke, kidney disease, and other problems. What are blood pressure readings? A blood pressure reading consists of a higher number over a lower number. Ideally, your blood pressure should be below 120/80. The first ("top") number is called the systolic pressure. It is a measure of the pressure in your arteries as your heart beats. The second ("bottom") number is called the diastolic pressure. It is a measure of the pressure in your arteries as the heart relaxes. What does my blood  pressure reading mean? Blood pressure is classified into four stages. Based on your blood pressure reading, your health care provider may use the following stages to determine what type of treatment you need, if any. Systolic pressure and diastolic pressure are measured in a unit called mm Hg. Normal  Systolic pressure: below 120.  Diastolic pressure: below 80. Elevated  Systolic pressure: 120-129.  Diastolic pressure: below 80. Hypertension stage 1  Systolic pressure: 130-139.  Diastolic pressure: 80-89. Hypertension stage 2  Systolic pressure: 140 or above.  Diastolic pressure: 90 or above. What health risks are associated with hypertension? Managing your hypertension is an important responsibility. Uncontrolled hypertension can lead to:  A heart attack.  A stroke.  A weakened blood vessel (aneurysm).  Heart failure.  Kidney damage.  Eye damage.  Metabolic syndrome.  Memory and concentration problems. What changes can I make to manage my hypertension? Hypertension can be managed by making lifestyle changes and possibly by taking medicines. Your health care provider will help you make a plan to bring your blood pressure within a normal range. Eating and drinking   Eat a diet that is high in fiber and potassium, and low in salt (sodium), added sugar, and fat. An example eating plan is called the DASH (Dietary Approaches to Stop Hypertension) diet. To eat this way: ? Eat plenty of fresh fruits and vegetables. Try to fill half of your plate at each meal with fruits and vegetables. ? Eat whole grains, such as whole wheat pasta, brown rice, or whole grain bread. Fill   about one quarter of your plate with whole grains. ? Eat low-fat diary products. ? Avoid fatty cuts of meat, processed or cured meats, and poultry with skin. Fill about one quarter of your plate with lean proteins such as fish, chicken without skin, beans, eggs, and tofu. ? Avoid premade and processed foods.  These tend to be higher in sodium, added sugar, and fat.  Reduce your daily sodium intake. Most people with hypertension should eat less than 1,500 mg of sodium a day.  Limit alcohol intake to no more than 1 drink a day for nonpregnant women and 2 drinks a day for men. One drink equals 12 oz of beer, 5 oz of wine, or 1 oz of hard liquor. Lifestyle  Work with your health care provider to maintain a healthy body weight, or to lose weight. Ask what an ideal weight is for you.  Get at least 30 minutes of exercise that causes your heart to beat faster (aerobic exercise) most days of the week. Activities may include walking, swimming, or biking.  Include exercise to strengthen your muscles (resistance exercise), such as weight lifting, as part of your weekly exercise routine. Try to do these types of exercises for 30 minutes at least 3 days a week.  Do not use any products that contain nicotine or tobacco, such as cigarettes and e-cigarettes. If you need help quitting, ask your health care provider.  Control any long-term (chronic) conditions you have, such as high cholesterol or diabetes. Monitoring  Monitor your blood pressure at home as told by your health care provider. Your personal target blood pressure may vary depending on your medical conditions, your age, and other factors.  Have your blood pressure checked regularly, as often as told by your health care provider. Working with your health care provider  Review all the medicines you take with your health care provider because there may be side effects or interactions.  Talk with your health care provider about your diet, exercise habits, and other lifestyle factors that may be contributing to hypertension.  Visit your health care provider regularly. Your health care provider can help you create and adjust your plan for managing hypertension. Will I need medicine to control my blood pressure? Your health care provider may prescribe  medicine if lifestyle changes are not enough to get your blood pressure under control, and if:  Your systolic blood pressure is 130 or higher.  Your diastolic blood pressure is 80 or higher. Take medicines only as told by your health care provider. Follow the directions carefully. Blood pressure medicines must be taken as prescribed. The medicine does not work as well when you skip doses. Skipping doses also puts you at risk for problems. Contact a health care provider if:  You think you are having a reaction to medicines you have taken.  You have repeated (recurrent) headaches.  You feel dizzy.  You have swelling in your ankles.  You have trouble with your vision. Get help right away if:  You develop a severe headache or confusion.  You have unusual weakness or numbness, or you feel faint.  You have severe pain in your chest or abdomen.  You vomit repeatedly.  You have trouble breathing. Summary  Hypertension is when the force of blood pumping through your arteries is too strong. If this condition is not controlled, it may put you at risk for serious complications.  Your personal target blood pressure may vary depending on your medical conditions, your age, and other   factors. For most people, a normal blood pressure is less than 120/80.  Hypertension is managed by lifestyle changes, medicines, or both. Lifestyle changes include weight loss, eating a healthy, low-sodium diet, exercising more, and limiting alcohol. This information is not intended to replace advice given to you by your health care provider. Make sure you discuss any questions you have with your health care provider. Document Released: 12/05/2011 Document Revised: 02/08/2016 Document Reviewed: 02/08/2016 Elsevier Interactive Patient Education  2019 [employer].

## 2018-06-06 NOTE — Progress Notes (Signed)
Established Patient Office Visit  Subjective:  Patient ID: Richard Bautista, male    DOB: 1971/03/02  Age: 48 y.o. MRN: [id number]  CC:  Chief Complaint  Patient presents with  . Hypertension    follow up     HPI [name] presents for   Hypertension: Patient here for follow-up of elevated blood pressure. He is exercising and is adherent to low salt diet.  Blood pressure is well controlled at home. Cardiac symptoms none. Patient denies chest pain, chest pressure/discomfort, exertional chest pressure/discomfort, irregular heart beat, lower extremity edema and orthopnea.  Cardiovascular risk factors: hypertension and male gender. Use of agents associated with hypertension: none. History of target organ damage: none. BP Readings from Last 3 Encounters:  06/06/18 136/80  05/17/18 (!) 150/86  05/07/18 (!) 146/95   Wt Readings from Last 3 Encounters:  06/06/18 257 lb (116.6 kg)  05/17/18 261 lb 3.2 oz (118.5 kg)  05/07/18 262 lb 6.4 oz (119 kg)    Cough He states that his cough resolved He had cxr that was negative No shortness of breath or wheezing   Patient noted that he has a lot of stress He is wondering if this is the cause of his blood pressure He has stress at work and at home  Past Medical History:  Diagnosis Date  . Blind one eye    right   . Detached retina   . Knee injury   . Pneumonia     Past Surgical History:  Procedure Laterality Date  . EYE SURGERY      No family history on file.  Social History   Socioeconomic History  . Marital status: Married    Spouse name: Not on file  . Number of children: Not on file  . Years of education: Not on file  . Highest education level: Not on file  Occupational History  . Not on file  Social Needs  . Financial resource strain: Not on file  . Food insecurity:    Worry: Not on file    Inability: Not on file  . Transportation needs:    Medical: Not on file    Non-medical: Not on file  Tobacco Use  .  Smoking status: Former Smoker    Types: Cigars    Last attempt to quit: 02/18/2018    Years since quitting: 0.2  . Smokeless tobacco: Never Used  Substance and Sexual Activity  . Alcohol use: Yes    Comment: socially  . Drug use: No  . Sexual activity: Not on file  Lifestyle  . Physical activity:    Days per week: Not on file    Minutes per session: Not on file  . Stress: Not on file  Relationships  . Social connections:    Talks on phone: Not on file    Gets together: Not on file    Attends religious service: Not on file    Active member of club or organization: Not on file    Attends meetings of clubs or organizations: Not on file    Relationship status: Not on file  . Intimate partner violence:    Fear of current or ex partner: Not on file    Emotionally abused: Not on file    Physically abused: Not on file    Forced sexual activity: Not on file  Other Topics Concern  . Not on file  Social History Narrative  . Not on file    Outpatient Medications Prior to  Visit  Medication Sig Dispense Refill  . lisinopril (PRINIVIL,ZESTRIL) 10 MG tablet Take 1 tablet (10 mg total) by mouth daily. 30 tablet 1  . benzonatate (TESSALON) 100 MG capsule Take 1 capsule (100 mg total) by mouth 3 (three) times daily as needed for cough. 20 capsule 0   No facility-administered medications prior to visit.     Allergies  Allergen Reactions  . Benadryl [Diphenhydramine] Other (See Comments)    Causes him to be woozy and not be able to control him self  . Erythromycin     REACTION: faint, chills  . Other Rash    "All metal. Jewelry."      ROS Review of Systems Review of Systems  Constitutional: Negative for activity change, appetite change, chills and fever.  HENT: Negative for congestion, nosebleeds, trouble swallowing and voice change.   Respiratory: Negative for cough, shortness of breath and wheezing.   Gastrointestinal: Negative for diarrhea, nausea and vomiting.   Genitourinary: Negative for difficulty urinating, dysuria, flank pain and hematuria.  Musculoskeletal: Negative for back pain, joint swelling and neck pain.  Neurological: Negative for dizziness, speech difficulty, light-headedness and numbness.  See HPI. All other review of systems negative.     Objective:    Physical Exam  BP 136/80   Pulse 83   Temp 98.5 F (36.9 C) (Oral)   Resp 18   Ht 6\' 2"  (1.88 m)   Wt 257 lb (116.6 kg)   SpO2 97%   BMI 33.00 kg/m  Wt Readings from Last 3 Encounters:  06/06/18 257 lb (116.6 kg)  05/17/18 261 lb 3.2 oz (118.5 kg)  05/07/18 262 lb 6.4 oz (119 kg)   Physical Exam  Constitutional: Oriented to person, place, and time. Appears well-developed and well-nourished.  HENT:  Head: Normocephalic and atraumatic.  Eyes: Conjunctivae and EOM are normal.  Cardiovascular: Normal rate, regular rhythm, normal heart sounds and intact distal pulses.  No murmur heard. Pulmonary/Chest: Effort normal and breath sounds normal. No stridor. No respiratory distress. Has no wheezes.  Neurological: Is alert and oriented to person, place, and time.  Skin: Skin is warm. Capillary refill takes less than 2 seconds.  Psychiatric: Has a normal mood and affect. Behavior is normal. Judgment and thought content normal.    There are no preventive care reminders to display for this patient.  There are no preventive care reminders to display for this patient.  Lab Results  Component Value Date   TSH 2.410 05/07/2018   Lab Results  Component Value Date   WBC 4.0 05/04/2017   HGB 12.7 (L) 05/04/2017   HCT 38.1 (L) 05/04/2017   MCV 76.0 (L) 05/04/2017   PLT 131 (L) 05/04/2017   Lab Results  Component Value Date   NA 140 05/07/2018   K 4.3 05/07/2018   CO2 21 05/07/2018   GLUCOSE 88 05/07/2018   BUN 17 05/07/2018   CREATININE 1.28 (H) 05/07/2018   BILITOT <0.2 05/07/2018   ALKPHOS 64 05/07/2018   AST 41 (H) 05/07/2018   ALT 14 05/07/2018   PROT 7.4  05/07/2018   ALBUMIN 4.5 05/07/2018   CALCIUM 9.6 05/07/2018   ANIONGAP 7 05/04/2017   Lab Results  Component Value Date   CHOL 228 (H) 05/07/2018   Lab Results  Component Value Date   HDL 42 05/07/2018   Lab Results  Component Value Date   LDLCALC Comment 05/07/2018   Lab Results  Component Value Date   TRIG 474 (H) 05/07/2018  Lab Results  Component Value Date   CHOLHDL 5.4 (H) 05/07/2018   No results found for: HGBA1C    Assessment & Plan:   Problem List Items Addressed This Visit      Cardiovascular and Mediastinum   HYPERTENSION, BENIGN ESSENTIAL- Patient's blood pressure is at goal of 139/89 or less. Condition is stable. Continue current medications and treatment plan. I recommend that you exercise for 30-45 minutes 5 days a week. I also recommend a balanced diet with fruits and vegetables every day, lean meats, and little fried foods. The DASH diet (you can find this online) is a good example of this.  -  Discussed that his blood pressure can be negatively impacted by stress thus he should continue to do stress management     Other   COUGH - Primary  -  Resolved       No orders of the defined types were placed in this encounter.   Follow-up: No follow-ups on file.    Doristine Bosworth, MD

## 2018-12-29 ENCOUNTER — Other Ambulatory Visit: Payer: Self-pay

## 2018-12-29 ENCOUNTER — Telehealth (INDEPENDENT_AMBULATORY_CARE_PROVIDER_SITE_OTHER): Payer: Medicare Other | Admitting: Family Medicine

## 2018-12-29 DIAGNOSIS — I10 Essential (primary) hypertension: Secondary | ICD-10-CM | POA: Diagnosis not present

## 2018-12-29 DIAGNOSIS — E781 Pure hyperglyceridemia: Secondary | ICD-10-CM

## 2018-12-29 DIAGNOSIS — Z5181 Encounter for therapeutic drug level monitoring: Secondary | ICD-10-CM | POA: Diagnosis not present

## 2018-12-29 DIAGNOSIS — R059 Cough, unspecified: Secondary | ICD-10-CM

## 2018-12-29 DIAGNOSIS — R05 Cough: Secondary | ICD-10-CM | POA: Diagnosis not present

## 2018-12-29 MED ORDER — LOSARTAN POTASSIUM 50 MG PO TABS: 50.0000 mg | ORAL_TABLET | Freq: Every day | ORAL | 0 refills | Status: DC

## 2018-12-29 NOTE — Patient Instructions (Signed)
° ° ° °  If you have lab work done today you will be contacted with your lab results within the next 2 weeks.  If you have not heard from us then please contact us. The fastest way to get your results is to register for My Chart. ° ° °IF you received an x-ray today, you will receive an invoice from [facility]. Please contact  Radiology at [phone] with questions or concerns regarding your invoice.  ° °IF you received labwork today, you will receive an invoice from [facility]. Please contact [facility] at [phone] with questions or concerns regarding your invoice.  ° °Our billing staff will not be able to assist you with questions regarding bills from these companies. ° °You will be contacted with the lab results as soon as they are available. The fastest way to get your results is to activate your My Chart account. Instructions are located on the last page of this paperwork. If you have not heard from us regarding the results in 2 weeks, please contact this office. °  ° ° ° °

## 2018-12-29 NOTE — Progress Notes (Signed)
Telemedicine Encounter- SOAP NOTE Established Patient  This telephone encounter was conducted with the patient's (or proxy's) verbal consent via audio telecommunications: yes/no: Yes Patient was instructed to have this encounter in a suitably private space; and to only have persons present to whom they give permission to participate. In addition, patient identity was confirmed by use of name plus two identifiers (DOB and address).  I discussed the limitations, risks, security and privacy concerns of performing an evaluation and management service by telephone and the availability of in person appointments. I also discussed with the patient that there may be a patient responsible charge related to this service. The patient expressed understanding and agreed to proceed.  I spent a total of TIME; 0 MIN TO 60 MIN: 25 minutes talking with the patient or their proxy.  CC: cough, hypertension   Subjective   Richard Bautista is a 48 y.o. established patient. Telephone visit today for  HPI  Patient reports having a cough and so he stopped the lisinopril He states that he was worried about the blood pressure He was coughing while eating, if he took a deep breath, after eating as well or if he was in a drafty area The cough was dry but sometimes would make him feel like he is going to throw up.  BP Readings from Last 3 Encounters:  06/06/18 136/80  05/17/18 (!) 150/86  05/07/18 (!) 146/95   Lab Results  Component Value Date   CREATININE 1.28 (H) 05/07/2018    Essential Hypertension  He does not check his bp regularly but feels well He does not think his blood pressure is high He states that he has been overeating at times and eating junk food.he does not stick to a DASH diet There is no chest pains, sob, palpitations, lower extremity edema    Patient Active Problem List   Diagnosis Date Noted  . Pain of right great toe 09/09/2013  . HYPERTRIGLYCERIDEMIA 05/06/2008  . COUGH  04/19/2008  . OBESITY 02/13/2008  . TOBACCO ABUSE 02/13/2008  . HYPERTENSION, BENIGN ESSENTIAL 02/13/2008  . GERD 02/13/2008  . 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 Blind one eye    right   . Detached retina   . Knee injury   . Pneumonia     Current Outpatient Medications  Medication Sig Dispense Refill  . losartan (COZAAR) 50 MG tablet Take 1 tablet (50 mg total) by mouth daily. 90 tablet 0   No current facility-administered medications for this visit.     Allergies  Allergen Reactions  . Benadryl [Diphenhydramine] Other (See Comments)    Causes him to be woozy and not be able to control him self  . Erythromycin     REACTION: faint, chills  . Other Rash    "All metal. Jewelry."      Social History   Socioeconomic History  . Marital status: Married    Spouse name: Not on file  . Number of children: Not on file  . Years of education: Not on file  . Highest education level: Not on file  Occupational History  . Not on file  Social Needs  . Financial resource strain: Not on file  . Food insecurity    Worry: Not on file    Inability: Not on file  . Transportation needs    Medical: Not on file    Non-medical: Not on file  Tobacco Use  .  Smoking status: Former Smoker    Types: Cigars    Quit date: 02/18/2018    Years since quitting: 0.8  . Smokeless tobacco: Never Used  Substance and Sexual Activity  . Alcohol use: Yes    Comment: socially  . Drug use: No  . Sexual activity: Not on file  Lifestyle  . Physical activity    Days per week: Not on file    Minutes per session: Not on file  . Stress: Not on file  Relationships  . Social Herbalist on phone: Not on file    Gets together: Not on file    Attends religious service: Not on file    Active member of club or organization: Not on file    Attends meetings of clubs or organizations: Not on file    Relationship status: Not on file  .  Intimate partner violence    Fear of current or ex partner: Not on file    Emotionally abused: Not on file    Physically abused: Not on file    Forced sexual activity: Not on file  Other Topics Concern  . Not on file  Social History Narrative  . Not on file    ROS Review of Systems  Constitutional: Negative for activity change, appetite change, chills and fever.  HENT: Negative for congestion, nosebleeds, trouble swallowing and voice change.   Respiratory: see hpi Gastrointestinal: Negative for diarrhea, nausea and vomiting.  Genitourinary: Negative for difficulty urinating, dysuria, flank pain and hematuria.  Musculoskeletal: Negative for back pain, joint swelling and neck pain.  Neurological: Negative for dizziness, speech difficulty, light-headedness and numbness.  See HPI. All other review of systems negative.   Objective   Vitals as reported by the patient: There were no vitals filed for this visit.   Alert and oriented, normal thought process No pulmonary distress  Diagnoses and all orders for this visit:  Essential hypertension- will use Losartan for bp and dc lisinopril -     CMP14+EGFR; Future  Medication monitoring encounter -     CMP14+EGFR; Future -     Lipid panel; Future  Cough- could be from lisinopril but it also sounds like GERD is a contributing factor Advised eating smaller meals  HYPERTRIGLYCERIDEMIA- will monitor -     Lipid panel; Future  Other orders -     losartan (COZAAR) 50 MG tablet; Take 1 tablet (50 mg total) by mouth daily.     I discussed the assessment and treatment plan with the patient. The patient was provided an opportunity to ask questions and all were answered. The patient agreed with the plan and demonstrated an understanding of the instructions.   The patient was advised to call back or seek an in-person evaluation if the symptoms worsen or if the condition fails to improve as anticipated.  I provided 25 minutes of  non-face-to-face time during this encounter.  Forrest Moron, MD  Primary Care at [facility]

## 2018-12-29 NOTE — Progress Notes (Signed)
CC: cough and side pain.  Per pt he is no longer having the side pain.  Pt c/o cough while eating and after eating, per pt drafts of air cause the cough as well.  Per pt not taking any otc meds for cough.  Pt takes lisinopril 10 mg and has discontinued as he says it makes him cough.  No recent bp or weight taken.  No travel outside the [place] or [place] in the past 3 weeks.

## 2019-01-01 ENCOUNTER — Encounter (HOSPITAL_COMMUNITY): Payer: Self-pay | Admitting: Emergency Medicine

## 2019-01-01 ENCOUNTER — Other Ambulatory Visit: Payer: Self-pay

## 2019-01-01 ENCOUNTER — Ambulatory Visit (INDEPENDENT_AMBULATORY_CARE_PROVIDER_SITE_OTHER): Payer: Medicare Other | Admitting: Family Medicine

## 2019-01-01 DIAGNOSIS — Z79899 Other long term (current) drug therapy: Secondary | ICD-10-CM | POA: Diagnosis not present

## 2019-01-01 DIAGNOSIS — R0789 Other chest pain: Secondary | ICD-10-CM | POA: Insufficient documentation

## 2019-01-01 DIAGNOSIS — I10 Essential (primary) hypertension: Secondary | ICD-10-CM | POA: Diagnosis not present

## 2019-01-01 DIAGNOSIS — R0781 Pleurodynia: Secondary | ICD-10-CM | POA: Diagnosis not present

## 2019-01-01 DIAGNOSIS — E781 Pure hyperglyceridemia: Secondary | ICD-10-CM | POA: Diagnosis not present

## 2019-01-01 DIAGNOSIS — R1031 Right lower quadrant pain: Secondary | ICD-10-CM | POA: Diagnosis present

## 2019-01-01 DIAGNOSIS — Z5181 Encounter for therapeutic drug level monitoring: Secondary | ICD-10-CM | POA: Diagnosis not present

## 2019-01-01 DIAGNOSIS — Z87891 Personal history of nicotine dependence: Secondary | ICD-10-CM | POA: Insufficient documentation

## 2019-01-01 NOTE — ED Triage Notes (Signed)
Patient reports right flank and rib pain after coughing x2 hours ago. Reports pain worsens with movement and coughing. Denies chest pain and SOB.

## 2019-01-01 NOTE — Progress Notes (Signed)
Nurse visit for labs only

## 2019-01-02 ENCOUNTER — Emergency Department (HOSPITAL_COMMUNITY)
Admission: EM | Admit: 2019-01-02 | Discharge: 2019-01-02 | Disposition: A | Discharge status: Home / Self Care | Payer: Medicare Other | Attending: Emergency Medicine | Admitting: Emergency Medicine

## 2019-01-02 ENCOUNTER — Emergency Department (HOSPITAL_COMMUNITY): Payer: Medicare Other

## 2019-01-02 DIAGNOSIS — R0781 Pleurodynia: Secondary | ICD-10-CM | POA: Diagnosis not present

## 2019-01-02 DIAGNOSIS — R0789 Other chest pain: Secondary | ICD-10-CM

## 2019-01-02 LAB — LIPID PANEL
Chol/HDL Ratio: 4.9 ratio (ref 0.0–5.0)
Cholesterol, Total: 230 mg/dL — ABNORMAL HIGH (ref 100–199)
HDL: 47 mg/dL (ref >39)
LDL Chol Calc (NIH): 150 mg/dL — ABNORMAL HIGH (ref 0–99)
Triglycerides: 184 mg/dL — ABNORMAL HIGH (ref 0–149)
VLDL Cholesterol Cal: 33 mg/dL (ref 5–40)

## 2019-01-02 LAB — CMP14+EGFR
ALT: 13 IU/L (ref 0–44)
AST: 23 IU/L (ref 0–40)
Albumin/Globulin Ratio: 1.6 (ref 1.2–2.2)
Albumin: 4.6 g/dL (ref 4.0–5.0)
Alkaline Phosphatase: 58 IU/L (ref 39–117)
BUN/Creatinine Ratio: 11 (ref 9–20)
BUN: 14 mg/dL (ref 6–24)
Bilirubin Total: 0.3 mg/dL (ref 0.0–1.2)
CO2: 23 mmol/L (ref 20–29)
Calcium: 9.5 mg/dL (ref 8.7–10.2)
Chloride: 101 mmol/L (ref 96–106)
Creatinine, Ser: 1.32 mg/dL — ABNORMAL HIGH (ref 0.76–1.27)
GFR calc Af Amer: 73 mL/min/{1.73_m2} (ref >59)
GFR calc non Af Amer: 63 mL/min/{1.73_m2} (ref >59)
Globulin, Total: 2.8 g/dL (ref 1.5–4.5)
Glucose: 129 mg/dL — ABNORMAL HIGH (ref 65–99)
Potassium: 4.2 mmol/L (ref 3.5–5.2)
Sodium: 137 mmol/L (ref 134–144)
Total Protein: 7.4 g/dL (ref 6.0–8.5)

## 2019-01-02 MED ORDER — BENZONATATE 100 MG PO CAPS: 100.0000 mg | ORAL_CAPSULE | Freq: Three times a day (TID) | ORAL | 0 refills | Status: DC | PRN

## 2019-01-02 MED ORDER — MELOXICAM 7.5 MG PO TABS: 15.0000 mg | ORAL_TABLET | Freq: Every day | ORAL | 0 refills | Status: DC | PRN

## 2019-01-02 NOTE — ED Provider Notes (Signed)
[facility] DEPT Provider Note   CSN: [id number] Arrival date & time: 01/01/19  2135     History   Chief Complaint Chief Complaint  Patient presents with  . Flank Pain    HPI Richard Bautista is a 48 y.o. male.     48 year old male presents to the emergency department for right lower chest wall pain.  Describes a sharp, sporadic pain which is aggravated with deep breathing and coughing.  Pain began 2 hours prior to arrival.  Had taken some ibuprofen earlier in the day before pain began, but does not feel that this helped improve his symptoms at all.  States that he has had a persistent, intermittent cough ongoing for a number of days.  No known fevers, shortness of breath, vomiting or diarrhea.  No history of trauma or injury.  The history is provided by the patient. No language interpreter was used.  Flank Pain    Past Medical History:  Diagnosis Date  . Blind one eye    right   . Detached retina   . Knee injury   . Pneumonia     Patient Active Problem List   Diagnosis Date Noted  . Pain of right great toe 09/09/2013  . HYPERTRIGLYCERIDEMIA 05/06/2008  . COUGH 04/19/2008  . OBESITY 02/13/2008  . TOBACCO ABUSE 02/13/2008  . HYPERTENSION, BENIGN ESSENTIAL 02/13/2008  . GERD 02/13/2008  . 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 EYE SURGERY          Home Medications    Prior to Admission medications   Medication Sig Start Date End Date Taking? Authorizing Provider  benzonatate (TESSALON) 100 MG capsule Take 1 capsule (100 mg total) by mouth 3 (three) times daily as needed for cough. 01/02/19   Antonietta Breach, PA-C  losartan (COZAAR) 50 MG tablet Take 1 tablet (50 mg total) by mouth daily. 12/29/18   Forrest Moron, MD  meloxicam (MOBIC) 7.5 MG tablet Take 2 tablets (15 mg total) by mouth daily as needed for pain. 01/02/19   Antonietta Breach, PA-C    Family History No  family history on file.  Social History Social History   Tobacco Use  . Smoking status: Former Smoker    Types: Cigars    Quit date: 02/18/2018    Years since quitting: 0.8  . Smokeless tobacco: Never Used  Substance Use Topics  . Alcohol use: Yes    Comment: socially  . Drug use: No     Allergies   Benadryl [diphenhydramine], Erythromycin, and Other   Review of Systems Review of Systems  Genitourinary: Positive for flank pain.   Ten systems reviewed and are negative for acute change, except as noted in the HPI.    Physical Exam Updated Vital Signs BP (!) 171/99   Pulse 84   Temp 98.2 F (36.8 C) (Oral)   Resp (!) 21   SpO2 99%   Physical Exam Vitals signs and nursing note reviewed.  Constitutional:      General: He is not in acute distress.    Appearance: He is well-developed. He is not diaphoretic.     Comments: Nontoxic appearing and in NAD  HENT:     Head: Normocephalic and atraumatic.  Eyes:     General: No scleral icterus.    Conjunctiva/sclera: Conjunctivae normal.  Neck:     Musculoskeletal: Normal range of motion.  Cardiovascular:  Rate and Rhythm: Normal rate and regular rhythm.     Pulses: Normal pulses.  Pulmonary:     Effort: Pulmonary effort is normal. No respiratory distress.     Breath sounds: No stridor. No wheezing.     Comments: Respirations even and unlabored. TTP to the anterior right lower chest wall without crepitus or spasm. Chest:     Chest wall: Tenderness present.  Abdominal:    Musculoskeletal: Normal range of motion.  Skin:    General: Skin is warm and dry.     Coloration: Skin is not pale.     Findings: No erythema or rash.  Neurological:     General: No focal deficit present.     Mental Status: He is alert and oriented to person, place, and time.     Coordination: Coordination normal.  Psychiatric:        Behavior: Behavior normal.      ED Treatments / Results  Labs (all labs ordered are listed, but only  abnormal results are displayed) Labs Reviewed - No data to display  EKG None  Radiology Dg Ribs Unilateral W/chest Right  Result Date: 01/02/2019 CLINICAL DATA:  Right rib pain EXAM: RIGHT RIBS AND CHEST - 3+ VIEW COMPARISON:  May 17, 2018 FINDINGS: No fracture or other bone lesions are seen involving the ribs. There is no evidence of pneumothorax or pleural effusion. Both lungs are clear. Heart size and mediastinal contours are within normal limits. IMPRESSION: Negative. Electronically Signed   By: Jonna Clark M.D.   On: 01/02/2019 02:22    Procedures Procedures (including critical care time)  Medications Ordered in ED Medications - No data to display   Initial Impression / Assessment and Plan / ED Course  I have reviewed the triage vital signs and the nursing notes.  Pertinent labs & imaging results that were available during my care of the patient were reviewed by me and considered in my medical decision making (see chart for details).        48 year old male presenting with symptoms consistent with costochondritis.  Pain is reproducible on palpation without crepitus or deformity.  X-ray today is reassuring.  Will discharge on Mobic.  Also given prescription for Tessalon for cough management.  Encouraged follow-up with his primary care doctor.  Return precautions discussed and provided. Patient discharged in stable condition with no unaddressed concerns.   Final Clinical Impressions(s) / ED Diagnoses   Final diagnoses:  Right-sided chest wall pain    ED Discharge Orders         Ordered    benzonatate (TESSALON) 100 MG capsule  3 times daily PRN     01/02/19 0332    meloxicam (MOBIC) 7.5 MG tablet  Daily PRN     01/02/19 0332           Antony Madura, PA-C 01/02/19 0351    Ward, Layla Maw, DO 01/02/19 0630

## 2019-01-02 NOTE — Discharge Instructions (Signed)
Take meloxicam as prescribed for management of pain.  Do not take this with Aleve, ibuprofen, Motrin as they are similar medications.  You may use Tessalon for cough management.  Alternate ice and heat to areas of pain.  Follow-up with your primary care doctor to ensure resolution of symptoms.

## 2019-01-08 ENCOUNTER — Other Ambulatory Visit: Payer: Self-pay | Admitting: Family Medicine

## 2019-01-08 DIAGNOSIS — E785 Hyperlipidemia, unspecified: Secondary | ICD-10-CM

## 2019-01-08 MED ORDER — PRAVASTATIN SODIUM 20 MG PO TABS: 20.0000 mg | ORAL_TABLET | Freq: Every day | ORAL | 3 refills | Status: DC

## 2019-06-11 ENCOUNTER — Telehealth: Payer: Self-pay | Admitting: *Deleted

## 2019-06-11 NOTE — Telephone Encounter (Signed)
Schedule AWV.  

## 2021-03-07 ENCOUNTER — Ambulatory Visit: Payer: Medicare Other | Admitting: Nurse Practitioner

## 2021-04-02 ENCOUNTER — Emergency Department (HOSPITAL_COMMUNITY): Payer: Medicare HMO

## 2021-04-02 ENCOUNTER — Emergency Department (HOSPITAL_COMMUNITY)
Admission: EM | Admit: 2021-04-02 | Discharge: 2021-04-02 | Disposition: A | Discharge status: Home / Self Care | Payer: Medicare HMO | Attending: Student | Admitting: Student

## 2021-04-02 ENCOUNTER — Encounter (HOSPITAL_COMMUNITY): Payer: Self-pay

## 2021-04-02 ENCOUNTER — Other Ambulatory Visit: Payer: Self-pay

## 2021-04-02 DIAGNOSIS — Z20822 Contact with and (suspected) exposure to covid-19: Secondary | ICD-10-CM | POA: Insufficient documentation

## 2021-04-02 DIAGNOSIS — B9789 Other viral agents as the cause of diseases classified elsewhere: Secondary | ICD-10-CM | POA: Diagnosis not present

## 2021-04-02 DIAGNOSIS — J069 Acute upper respiratory infection, unspecified: Secondary | ICD-10-CM | POA: Insufficient documentation

## 2021-04-02 DIAGNOSIS — R5383 Other fatigue: Secondary | ICD-10-CM | POA: Diagnosis present

## 2021-04-02 DIAGNOSIS — R0602 Shortness of breath: Secondary | ICD-10-CM | POA: Diagnosis not present

## 2021-04-02 LAB — RESP PANEL BY RT-PCR (FLU A&B, COVID) ARPGX2
Influenza A by PCR: NEGATIVE
Influenza B by PCR: NEGATIVE
SARS Coronavirus 2 by RT PCR: NEGATIVE

## 2021-04-02 MED ORDER — BENZONATATE 100 MG PO CAPS: 100.0000 mg | ORAL_CAPSULE | Freq: Three times a day (TID) | ORAL | 0 refills | Status: DC | PRN

## 2021-04-02 MED ORDER — IBUPROFEN 200 MG PO TABS
600.0000 mg | ORAL_TABLET | Freq: Once | ORAL | Status: AC
  Administered 2021-04-02: 600 mg via ORAL
  Filled 2021-04-02: qty 3

## 2021-04-02 NOTE — ED Triage Notes (Signed)
Pt c/o weakness and a cough for 3 days. Pt states he has taken Mucinex to help control the cough.

## 2021-04-02 NOTE — ED Provider Notes (Signed)
[facility]-EMERGENCY DEPT Provider Note   CSN: [id number] Arrival date & time: 04/02/21  4098     History  No chief complaint on file.   Richard Bautista is a 51 y.o. male who presents emergency department for evaluation of generalized fatigue and weakness and a cough.  Patient states that symptoms been present for 3 days.  Cough is nonproductive.  He has been taking Mucinex D and has been feeling more sleepy during the day after taking this medication.  He denies chest pain, shortness of breath, abdominal pain, nausea, vomiting, diarrhea or other systemic symptoms.  HPI     Home Medications Prior to Admission medications   Medication Sig Start Date End Date Taking? Authorizing Provider  benzonatate (TESSALON) 100 MG capsule Take 1 capsule (100 mg total) by mouth 3 (three) times daily as needed for cough. 04/02/21   Geral Coker, MD  losartan (COZAAR) 50 MG tablet Take 1 tablet (50 mg total) by mouth daily. 12/29/18   Doristine Bosworth, MD  meloxicam (MOBIC) 7.5 MG tablet Take 2 tablets (15 mg total) by mouth daily as needed for pain. 01/02/19   Antony Madura, PA-C  pravastatin (PRAVACHOL) 20 MG tablet Take 1 tablet (20 mg total) by mouth daily. 01/08/19   Doristine Bosworth, MD      Allergies    Benadryl [diphenhydramine], Erythromycin, and Other    Review of Systems   Review of Systems  Constitutional:  Positive for fatigue.  Respiratory:  Positive for cough.   All other systems reviewed and are negative.  Physical Exam Updated Vital Signs BP (!) 154/101 (BP Location: Right Arm)    Pulse 78    Temp 97.8 F (36.6 C) (Oral)    Resp 18    Ht 6\' 2"  (1.88 m)    Wt 116.2 kg    SpO2 96%    BMI 32.89 kg/m  Physical Exam Vitals and nursing note reviewed.  Constitutional:      General: He is not in acute distress.    Appearance: He is well-developed.  HENT:     Head: Normocephalic and atraumatic.  Eyes:     Conjunctiva/sclera: Conjunctivae normal.   Cardiovascular:     Rate and Rhythm: Normal rate and regular rhythm.     Heart sounds: No murmur heard. Pulmonary:     Effort: Pulmonary effort is normal. No respiratory distress.     Breath sounds: Normal breath sounds.  Abdominal:     Palpations: Abdomen is soft.     Tenderness: There is no abdominal tenderness.  Musculoskeletal:        General: No swelling.     Cervical back: Neck supple.  Skin:    General: Skin is warm and dry.     Capillary Refill: Capillary refill takes less than 2 seconds.  Neurological:     Mental Status: He is alert.  Psychiatric:        Mood and Affect: Mood normal.    ED Results / Procedures / Treatments   Labs (all labs ordered are listed, but only abnormal results are displayed) Labs Reviewed  RESP PANEL BY RT-PCR (FLU A&B, COVID) [id number]    EKG None  Radiology DG Chest Portable 1 View  Result Date: 04/02/2021 CLINICAL DATA:  Dry cough and shortness of breath over the last several days. EXAM: PORTABLE CHEST 1 VIEW COMPARISON:  01/02/2019. FINDINGS: Cardiac silhouette normal in size and configuration. Small hiatal hernia. No mediastinal or hilar masses. Clear lungs.  No pleural effusion or pneumothorax. Skeletal structures are grossly intact. IMPRESSION: No active disease. Electronically Signed   By: Amie Portland M.D.   On: 04/02/2021 08:30    Procedures .1-3 Lead EKG Interpretation Performed by: Glendora Score, MD Authorized by: Glendora Score, MD     Interpretation: normal     ECG rate assessment: normal     Rhythm: sinus rhythm     Ectopy: none     Conduction: normal      Medications Ordered in ED Medications  ibuprofen (ADVIL) tablet 600 mg (600 mg Oral Given 04/02/21 5638)    ED Course/ Medical Decision Making/ A&P                           Medical Decision Making  Patient seen emergency department for evaluation of a cough and generalized fatigue.  Physical exam is unremarkable.  Patient is COVID and flu negative.  Chest  x-ray with no evidence of pneumonia or any other critical pathology.  I independently reviewed these imaging studies my findings agree with radiology read.  Patient presentation consistent with a viral URI and he was discharged with a prescription for [facility].        Final Clinical Impression(s) / ED Diagnoses Final diagnoses:  Viral URI    Rx / DC Orders ED Discharge Orders          Ordered    benzonatate (TESSALON) 100 MG capsule  3 times daily PRN        04/02/21 0858              Glendora Score, MD 04/02/21 1437

## 2021-08-26 ENCOUNTER — Emergency Department (HOSPITAL_COMMUNITY)
Admission: EM | Admit: 2021-08-26 | Discharge: 2021-08-26 | Disposition: A | Discharge status: Home / Self Care | Payer: Medicare HMO | Attending: Emergency Medicine | Admitting: Emergency Medicine

## 2021-08-26 ENCOUNTER — Encounter (HOSPITAL_COMMUNITY): Payer: Self-pay

## 2021-08-26 ENCOUNTER — Other Ambulatory Visit: Payer: Self-pay

## 2021-08-26 DIAGNOSIS — L237 Allergic contact dermatitis due to plants, except food: Secondary | ICD-10-CM | POA: Insufficient documentation

## 2021-08-26 DIAGNOSIS — T7840XA Allergy, unspecified, initial encounter: Secondary | ICD-10-CM

## 2021-08-26 MED ORDER — METHYLPREDNISOLONE SODIUM SUCC 125 MG IJ SOLR
125.0000 mg | Freq: Once | INTRAMUSCULAR | Status: AC
  Administered 2021-08-26: 125 mg via INTRAVENOUS

## 2021-08-26 MED ORDER — LORATADINE 10 MG PO TABS
10.0000 mg | ORAL_TABLET | Freq: Once | ORAL | Status: AC
  Administered 2021-08-26: 10 mg via ORAL
  Filled 2021-08-26: qty 1

## 2021-08-26 MED ORDER — METHYLPREDNISOLONE SODIUM SUCC 125 MG IJ SOLR
125.0000 mg | Freq: Once | INTRAMUSCULAR | Status: DC
  Filled 2021-08-26: qty 2

## 2021-08-26 MED ORDER — PREDNISONE 10 MG (21) PO TBPK: ORAL_TABLET | Freq: Every day | ORAL | 0 refills | Status: AC

## 2021-08-26 MED ORDER — FAMOTIDINE IN NACL 20-0.9 MG/50ML-% IV SOLN
20.0000 mg | Freq: Once | INTRAVENOUS | Status: AC
  Administered 2021-08-26: 20 mg via INTRAVENOUS
  Filled 2021-08-26: qty 50

## 2021-08-26 MED ORDER — ONDANSETRON HCL 4 MG/2ML IJ SOLN
4.0000 mg | Freq: Once | INTRAMUSCULAR | Status: AC
  Administered 2021-08-26: 4 mg via INTRAVENOUS
  Filled 2021-08-26: qty 2

## 2021-08-26 MED ORDER — FAMOTIDINE 20 MG PO TABS: 20.0000 mg | ORAL_TABLET | Freq: Every day | ORAL | 0 refills | Status: AC

## 2021-08-26 NOTE — Discharge Instructions (Addendum)
Your exam today showed rash that is consistent with poison ivy dermatitis.  You received a dose of steroids, Pepcid, and Claritin in the emergency room.  You state you are allergic to Benadryl but can take Claritin.  Continue taking Claritin at home until this resolves.  I have sent 2-week duration of Pepcid.  And I have sent in a prednisone taper.  You received your first dose in the emergency room today.  You can start to taper tomorrow.  If any worsening symptoms, difficulty swallowing please return to the emergency room for evaluation.  Since you do not have a primary care provider have attached follow-up information for [facility] listed above.  You can establish care with them.

## 2021-08-26 NOTE — ED Triage Notes (Addendum)
Patient was doing yard work 2 days ago and today woke up with a swollen face, rash on his left arm with blisters that are weeping. Right eye is swollen shut.

## 2021-08-26 NOTE — ED Provider Notes (Signed)
[facility]-EMERGENCY DEPT Provider Note   CSN: [id number] Arrival date & time: 08/26/21  [id number]     History  Chief Complaint  Patient presents with   Allergic Reaction    Richard Bautista is a 51 y.o. male.  51 year old male presents today for evaluation of swelling and redness of the face primarily around his right eye.  Patient states on Thursday he was working out in the yard with what he believes were either poison ivy, or sumac bushes/trees.  He states Thursday evening he noted a rash consistent with poison ivy on his left forearm.  He states Friday his face was affected and today he woke up with significant worsening in the swelling of his face which involved his eye.  At baseline he is blind in the right eye.  However typically does not have swelling.  Denies pain with eye movement.  He is without fever.  States he has been touching his face after scratching his arm.  Has not take anything prior to arrival.  States he is allergic to Benadryl.  Mom states he takes Claritin without difficulty.  The history is provided by the patient. No language interpreter was used.      Home Medications Prior to Admission medications   Medication Sig Start Date End Date Taking? Authorizing Provider  calamine lotion Apply 1 application. topically 3 (three) times daily as needed for itching.   Yes [provider]  OVER THE COUNTER MEDICATION Take 1 capsule by mouth daily. Seamoss   Yes [provider]  OVER THE COUNTER MEDICATION Take 1 capsule by mouth daily. Elderberry.   Yes [provider]  OVER THE COUNTER MEDICATION Take 1 Pump by mouth daily. Isotonix.   Yes [provider]  benzonatate (TESSALON) 100 MG capsule Take 1 capsule (100 mg total) by mouth 3 (three) times daily as needed for cough. Patient not taking: Reported on 08/26/2021 04/02/21   [name], [name], MD  losartan (COZAAR) 50 MG tablet Take 1 tablet (50 mg total) by mouth  daily. Patient not taking: Reported on 08/26/2021 12/29/18   [name], [name], MD  pravastatin (PRAVACHOL) 20 MG tablet Take 1 tablet (20 mg total) by mouth daily. Patient not taking: Reported on 08/26/2021 01/08/19   [name], [name], MD      Allergies    Benadryl [diphenhydramine], Erythromycin, Other, and Poison ivy extract    Review of Systems   Review of Systems  Constitutional:  Negative for chills and fever.  HENT:  Negative for sore throat and trouble swallowing.   Eyes:  Positive for redness. Negative for photophobia, pain and discharge.  Respiratory:  Negative for shortness of breath.   Skin:  Positive for rash.  All other systems reviewed and are negative.  Physical Exam Updated Vital Signs BP (!) 159/93   Pulse 61   Temp 98.3 F (36.8 C) (Oral)   Resp 18   Ht 6\' 3"  (1.905 m)   Wt 113.4 kg   SpO2 98%   BMI 31.25 kg/m  Physical Exam Vitals and nursing note reviewed.  Constitutional:      General: He is not in acute distress.    Appearance: Normal appearance. He is not ill-appearing.  HENT:     Head: Normocephalic and atraumatic.     Nose: Nose normal.     Mouth/Throat:     Mouth: Mucous membranes are moist.     Pharynx: Oropharynx is clear. Uvula midline. No pharyngeal swelling, oropharyngeal exudate,  posterior oropharyngeal erythema or uvula swelling.     Tonsils: No tonsillar exudate or tonsillar abscesses.     Comments: Airway patent.  Without throat swelling.  Uvula midline. Eyes:     General: No scleral icterus.    Extraocular Movements: Extraocular movements intact.     Conjunctiva/sclera: Conjunctivae normal.     Comments: Right eye swollen shut.  Able to open with manual pressure without difficulty.  Mild conjunctivitis noted.  No pain with extraocular eye movements.  Cardiovascular:     Rate and Rhythm: Normal rate and regular rhythm.     Pulses: Normal pulses.     Heart sounds: Normal heart sounds.  Pulmonary:     Effort: Pulmonary effort is normal.  No respiratory distress.     Breath sounds: Normal breath sounds. No wheezing or rales.  Abdominal:     General: There is no distension.     Tenderness: There is no abdominal tenderness.  Musculoskeletal:        General: Normal range of motion.     Cervical back: Normal range of motion.  Skin:    General: Skin is warm and dry.  Neurological:     General: No focal deficit present.     Mental Status: He is alert. Mental status is at baseline.    ED Results / Procedures / Treatments   Labs (all labs ordered are listed, but only abnormal results are displayed) Labs Reviewed - No data to display  EKG None  Radiology No results found.  Procedures Procedures    Medications Ordered in ED Medications  methylPREDNISolone sodium succinate (SOLU-MEDROL) 125 mg/2 mL injection 125 mg (125 mg Intravenous Given 08/26/21 0657)  ondansetron (ZOFRAN) injection 4 mg (4 mg Intravenous Given 08/26/21 0710)  famotidine (PEPCID) IVPB 20 mg premix (20 mg Intravenous New Bag/Given 08/26/21 0730)  loratadine (CLARITIN) tablet 10 mg (10 mg Oral Given 08/26/21 0725)    ED Course/ Medical Decision Making/ A&P                           Medical Decision Making Risk OTC drugs. Prescription drug management.     Medical Decision Making / ED Course   This patient presents to the ED for concern of facial swelling, this involves an extensive number of treatment options, and is a complaint that carries with it a high risk of complications and morbidity.  The differential diagnosis includes allergic reaction, poison ivy, periorbital cellulitis, orbital cellulitis  MDM: [name] is a pleasant 51 year old male who presents today for evaluation of erythema and swelling of his face predominantly involving the right eye.  He is without extraocular eye movements.  Rash appears consistent with poison ivy dermatitis.  Right forearm with blisters weeping.  He endorses touching his left forearm and then touching his  face.  He also has some rash present on his right ear.  He states he touched his phone and then placed a phone over his ear.  We will provide IV Solu-Medrol, Pepcid, Claritin.  Given no extraocular movement and story most consistent with poison ivy/allergic reaction will need to evaluate with CT imaging for ocular involvement.  He is afebrile.  Symptomatic treatment discussed.  Return precautions discussed.  Patient does not have PCP.  Will provide with [facility] information.  We will discharge on prednisone Dosepak, and Pepcid.  Patient throughout his stay in the emergency room remained stable.  Tolerating medications  well.  Patient is appropriate for discharge.  Discharged in stable condition.  Prednisone taper and Pepcid prescribed.  Discussed continuing Claritin.   Lab Tests: -I ordered, reviewed, and interpreted labs.   The pertinent results include:   Labs Reviewed - No data to display    EKG  EKG Interpretation  Date/Time:    Ventricular Rate:    PR Interval:    QRS Duration:   QT Interval:    QTC Calculation:   R Axis:     Text Interpretation:           Medicines ordered and prescription drug management: Meds ordered this encounter  Medications   DISCONTD: methylPREDNISolone sodium succinate (SOLU-MEDROL) 125 mg/2 mL injection 125 mg    IV methylprednisolone will be converted to either a q12h or q24h frequency with the same total daily dose (TDD).  Ordered Dose: 1 to 125 mg TDD; convert to: TDD q24h.  Ordered Dose: 126 to 250 mg TDD; convert to: TDD div q12h.  Ordered Dose: >250 mg TDD; DAW.   methylPREDNISolone sodium succinate (SOLU-MEDROL) 125 mg/2 mL injection 125 mg    IV methylprednisolone will be converted to either a q12h or q24h frequency with the same total daily dose (TDD).  Ordered Dose: 1 to 125 mg TDD; convert to: TDD q24h.  Ordered Dose: 126 to 250 mg TDD; convert to: TDD div q12h.  Ordered Dose: >250 mg TDD; DAW.    ondansetron (ZOFRAN) injection 4 mg   famotidine (PEPCID) IVPB 20 mg premix   loratadine (CLARITIN) tablet 10 mg   predniSONE (STERAPRED UNI-PAK 21 TAB) 10 MG (21) TBPK tablet    Sig: Take by mouth daily. Take 6 tabs by mouth daily  for 2 days, then 5 tabs for 2 days, then 4 tabs for 2 days, then 3 tabs for 2 days, 2 tabs for 2 days, then 1 tab by mouth daily for 2 days    Dispense:  42 tablet    Refill:  0    Order Specific Question:   Supervising Provider    Answer:   Hyacinth Meeker, BRIAN [[id number]]   famotidine (PEPCID) 20 MG tablet    Sig: Take 1 tablet (20 mg total) by mouth daily.    Dispense:  14 tablet    Refill:  0    Order Specific Question:   Supervising Provider    Answer:   MILLER, BRIAN [[id number]]    -I have reviewed the patients home medicines and have made adjustments as needed  Reevaluation: After the interventions noted above, I reevaluated the patient and found that they have :stayed the same  Co morbidities that complicate the patient evaluation  Past Medical History:  Diagnosis Date   Blind one eye    right    Detached retina    Knee injury    Pneumonia       Dispostion: Patient is appropriate for discharge.  Discharged in stable condition.  Return precautions discussed.    Final Clinical Impression(s) / ED Diagnoses Final diagnoses:  Allergic reaction, initial encounter  Poison ivy dermatitis    Rx / DC Orders ED Discharge Orders          Ordered    predniSONE (STERAPRED UNI-PAK 21 TAB) 10 MG (21) TBPK tablet  Daily        08/26/21 0850    famotidine (PEPCID) 20 MG tablet  Daily        08/26/21 0850  Marita Kansas, PA-C 08/26/21 3536    Bethann Berkshire, MD 08/26/21 [phone]

## 2022-05-13 ENCOUNTER — Emergency Department (HOSPITAL_COMMUNITY)
Admission: EM | Admit: 2022-05-13 | Discharge: 2022-05-14 | Disposition: A | Discharge status: Home / Self Care | Payer: Self-pay | Attending: Emergency Medicine | Admitting: Emergency Medicine

## 2022-05-13 ENCOUNTER — Emergency Department (HOSPITAL_COMMUNITY): Payer: Self-pay

## 2022-05-13 ENCOUNTER — Other Ambulatory Visit: Payer: Self-pay

## 2022-05-13 ENCOUNTER — Encounter (HOSPITAL_COMMUNITY): Payer: Self-pay

## 2022-05-13 DIAGNOSIS — K209 Esophagitis, unspecified without bleeding: Secondary | ICD-10-CM | POA: Insufficient documentation

## 2022-05-13 DIAGNOSIS — B349 Viral infection, unspecified: Secondary | ICD-10-CM | POA: Insufficient documentation

## 2022-05-13 DIAGNOSIS — E860 Dehydration: Secondary | ICD-10-CM | POA: Insufficient documentation

## 2022-05-13 DIAGNOSIS — Z1152 Encounter for screening for COVID-19: Secondary | ICD-10-CM | POA: Insufficient documentation

## 2022-05-13 LAB — COMPREHENSIVE METABOLIC PANEL
ALT: 16 U/L (ref 0–44)
AST: 31 U/L (ref 15–41)
Albumin: 4 g/dL (ref 3.5–5.0)
Alkaline Phosphatase: 41 U/L (ref 38–126)
Anion gap: 8 (ref 5–15)
BUN: 22 mg/dL — ABNORMAL HIGH (ref 6–20)
CO2: 21 mmol/L — ABNORMAL LOW (ref 22–32)
Calcium: 8.2 mg/dL — ABNORMAL LOW (ref 8.9–10.3)
Chloride: 101 mmol/L (ref 98–111)
Creatinine, Ser: 1.33 mg/dL — ABNORMAL HIGH (ref 0.61–1.24)
GFR, Estimated: >60 mL/min (ref >60)
Glucose, Bld: 136 mg/dL — ABNORMAL HIGH (ref 70–99)
Potassium: 3.3 mmol/L — ABNORMAL LOW (ref 3.5–5.1)
Sodium: 130 mmol/L — ABNORMAL LOW (ref 135–145)
Total Bilirubin: 1.1 mg/dL (ref 0.3–1.2)
Total Protein: 7.1 g/dL (ref 6.5–8.1)

## 2022-05-13 LAB — APTT: aPTT: 30 seconds (ref 24–36)

## 2022-05-13 LAB — CBC WITH DIFFERENTIAL/PLATELET
Abs Immature Granulocytes: 0.02 10*3/uL (ref 0.00–0.07)
Basophils Absolute: 0 10*3/uL (ref 0.0–0.1)
Basophils Relative: 0 %
Eosinophils Absolute: 0 10*3/uL (ref 0.0–0.5)
Eosinophils Relative: 0 %
HCT: 44 % (ref 39.0–52.0)
Hemoglobin: 14.2 g/dL (ref 13.0–17.0)
Immature Granulocytes: 0 %
Lymphocytes Relative: 3 %
Lymphs Abs: 0.2 10*3/uL — ABNORMAL LOW (ref 0.7–4.0)
MCH: 25.1 pg — ABNORMAL LOW (ref 26.0–34.0)
MCHC: 32.3 g/dL (ref 30.0–36.0)
MCV: 77.9 fL — ABNORMAL LOW (ref 80.0–100.0)
Monocytes Absolute: 0.2 10*3/uL (ref 0.1–1.0)
Monocytes Relative: 2 %
Neutro Abs: 7 10*3/uL (ref 1.7–7.7)
Neutrophils Relative %: 95 %
Platelets: 121 10*3/uL — ABNORMAL LOW (ref 150–400)
RBC: 5.65 MIL/uL (ref 4.22–5.81)
RDW: 14.8 % (ref 11.5–15.5)
WBC: 7.4 10*3/uL (ref 4.0–10.5)
nRBC: 0 % (ref 0.0–0.2)

## 2022-05-13 LAB — LACTIC ACID, PLASMA: Lactic Acid, Venous: 1.8 mmol/L (ref 0.5–1.9)

## 2022-05-13 LAB — RESP PANEL BY RT-PCR (RSV, FLU A&B, COVID)  RVPGX2
Influenza A by PCR: NEGATIVE
Influenza B by PCR: NEGATIVE
Resp Syncytial Virus by PCR: NEGATIVE
SARS Coronavirus 2 by RT PCR: NEGATIVE

## 2022-05-13 LAB — PROTIME-INR
INR: 1.2 (ref 0.8–1.2)
Prothrombin Time: 15.2 seconds (ref 11.4–15.2)

## 2022-05-13 LAB — LIPASE, BLOOD: Lipase: 28 U/L (ref 11–51)

## 2022-05-13 MED ORDER — LACTATED RINGERS IV BOLUS (SEPSIS)
1000.0000 mL | Freq: Once | INTRAVENOUS | Status: AC
  Administered 2022-05-13: 1000 mL via INTRAVENOUS

## 2022-05-13 MED ORDER — LACTATED RINGERS IV BOLUS
1000.0000 mL | Freq: Once | INTRAVENOUS | Status: AC
  Administered 2022-05-13: 1000 mL via INTRAVENOUS

## 2022-05-13 MED ORDER — ACETAMINOPHEN 500 MG PO TABS
1000.0000 mg | ORAL_TABLET | Freq: Once | ORAL | Status: AC
  Administered 2022-05-13: 1000 mg via ORAL
  Filled 2022-05-13: qty 2

## 2022-05-13 MED ORDER — SODIUM CHLORIDE 0.9 % IV BOLUS: 1000.0000 mL | Freq: Once | INTRAVENOUS | Status: DC

## 2022-05-13 MED ORDER — ONDANSETRON HCL 4 MG/2ML IJ SOLN
4.0000 mg | Freq: Once | INTRAMUSCULAR | Status: AC
  Administered 2022-05-13: 4 mg via INTRAVENOUS
  Filled 2022-05-13: qty 2

## 2022-05-13 NOTE — ED Provider Notes (Signed)
[place] AT [facility] Provider Note   CSN: [id number] Arrival date & time: 05/13/22  2054     History {Add pertinent medical, surgical, social history, OB history to HPI:1} Chief Complaint  Patient presents with   Fever    Richard Bautista is a 52 y.o. male presents today for evaluation of fever, fatigue.  Patient reports he has had fever, fatigue, headache, diarrhea, nausea, vomiting x10 since yesterday.  He also reports abdominal pain mostly on the left side.  Reports non bloody diarrhea.  No known sick contacts.  Denies urinary symptoms, blood in stool or urine. Denies chest pain or shortness of breath.   Fever   Past Medical History:  Diagnosis Date   Blind one eye    right    Detached retina    Knee injury    Pneumonia    Past Surgical History:  Procedure Laterality Date   EYE SURGERY       Home Medications Prior to Admission medications   Medication Sig Start Date End Date Taking? Authorizing Provider  benzonatate (TESSALON) 100 MG capsule Take 1 capsule (100 mg total) by mouth 3 (three) times daily as needed for cough. Patient not taking: Reported on 08/26/2021 04/02/21   [name], Debe Coder, MD  calamine lotion Apply 1 application. topically 3 (three) times daily as needed for itching.    [provider]  famotidine (PEPCID) 20 MG tablet Take 1 tablet (20 mg total) by mouth daily. 08/26/21   Deatra Canter, Amjad, PA-C  losartan (COZAAR) 50 MG tablet Take 1 tablet (50 mg total) by mouth daily. Patient not taking: Reported on 08/26/2021 12/29/18   Forrest Moron, MD  OVER THE COUNTER MEDICATION Take 1 capsule by mouth daily. Seamoss    [provider]  OVER THE COUNTER MEDICATION Take 1 capsule by mouth daily. Elderberry.    [provider]  OVER THE COUNTER MEDICATION Take 1 Pump by mouth daily. Isotonix.    [provider]  pravastatin (PRAVACHOL) 20 MG tablet Take 1 tablet (20 mg total) by mouth daily. Patient  not taking: Reported on 08/26/2021 01/08/19   Forrest Moron, MD  predniSONE (STERAPRED UNI-PAK 21 TAB) 10 MG (21) TBPK tablet Take by mouth daily. Take 6 tabs by mouth daily  for 2 days, then 5 tabs for 2 days, then 4 tabs for 2 days, then 3 tabs for 2 days, 2 tabs for 2 days, then 1 tab by mouth daily for 2 days 08/26/21   Evlyn Courier, PA-C      Allergies    Benadryl [diphenhydramine], Erythromycin, Other, and Poison ivy extract    Review of Systems   Review of Systems  Constitutional:  Positive for fever.    Physical Exam Updated Vital Signs BP (!) 146/98   Pulse (!) 139   Temp (!) 101.2 F (38.4 C) (Oral)   Resp (!) 22   SpO2 94%  Physical Exam Vitals and nursing note reviewed.  Constitutional:      Appearance: Normal appearance.  HENT:     Head: Normocephalic and atraumatic.     Mouth/Throat:     Mouth: Mucous membranes are moist.  Eyes:     General: No scleral icterus. Cardiovascular:     Rate and Rhythm: Normal rate and regular rhythm.     Pulses: Normal pulses.     Heart sounds: Normal heart sounds.  Pulmonary:     Effort: Pulmonary effort is normal.  Breath sounds: Normal breath sounds.  Abdominal:     General: Abdomen is flat.     Palpations: Abdomen is soft.     Tenderness: There is abdominal tenderness (LUQ).  Musculoskeletal:        General: No deformity.  Skin:    General: Skin is warm.     Findings: No rash.  Neurological:     General: No focal deficit present.     Mental Status: He is alert.  Psychiatric:        Mood and Affect: Mood normal.     ED Results / Procedures / Treatments   Labs (all labs ordered are listed, but only abnormal results are displayed) Labs Reviewed  RESP PANEL BY RT-PCR (RSV, FLU A&B, COVID)  RVPGX2  CBC WITH DIFFERENTIAL/PLATELET  COMPREHENSIVE METABOLIC PANEL  LIPASE, BLOOD  URINALYSIS, ROUTINE W REFLEX MICROSCOPIC    EKG None  Radiology No results found.  Procedures Procedures  {Document cardiac  monitor, telemetry assessment procedure when appropriate:1}  Medications Ordered in ED Medications  acetaminophen (TYLENOL) tablet 1,000 mg (has no administration in time range)  ondansetron (ZOFRAN) injection 4 mg (has no administration in time range)  sodium chloride 0.9 % bolus 1,000 mL (has no administration in time range)    ED Course/ Medical Decision Making/ A&P Clinical Course as of 05/13/22 2244  Sun May 13, 2022  2217 I evaluated personally at bedside.  Patient with 1 day of illness.  Its diffuse nature illness.  He is having left upper quadrant pain, some vague cough like symptoms.  He is also endorsing nausea without vomiting .  Poor p.o. intake today. [CC]    Clinical Course User Index [CC] Tretha Sciara, MD   {   Click here for ABCD2, HEART and other calculatorsREFRESH Note before signing :1}                          Medical Decision Making Amount and/or Complexity of Data Reviewed Labs: ordered. Radiology: ordered. ECG/medicine tests: ordered.  Risk OTC drugs. Prescription drug management.   This patient presents to the ED for fever, fatigue, headache, nausea, vomiting, this involves an extensive number of treatment options, and is a complaint that carries with a high risk of complications and morbidity.  The differential diagnosis includes sepsis, appendicitis, gallbladder, pancreatitis, SBP, AOM, pneumonia, prostatitis, pharyngitis, URI, UTI, pyelonephritis, CNS lesion, sympathomimetic, thyroid storm..  This is not an exhaustive list.  Lab tests: I ordered and personally interpreted labs.  The pertinent results include: WBC unremarkable. Hbg unremarkable. Platelets unremarkable. Electrolytes unremarkable. BUN, creatinine unremarkable. UA significant for no acute abnormality.  Imaging studies: I ordered imaging studies. I personally reviewed, interpreted imaging and agree with the radiologist's interpretations. The results include: CT abdomen pelvis pending.    Problem list/ ED course/ Critical interventions/ Medical management: HPI: See above Vital signs within normal range and stable throughout visit. Laboratory/imaging studies significant for: See above. On physical examination, patient is afebrile and appears in no acute distress.  There was tenderness to palpation to left upper quadrant.  Chest x-ray normal so low suspicion for pneumonia.  CT scan of abdomen pelvis pending.  Lactated ringer ordered.  Zofran ordered for nausea.  Tylenol ordered for fever. 11:57 PM Reevaluated patient at bedside.  Patient states he felt better.  Pending urinalysis and CT abdomen pelvis. I have reviewed the patient home medicines and have made adjustments as needed.  Cardiac monitoring/EKG: The patient was maintained  on a cardiac monitor.  I personally reviewed and interpreted the cardiac monitor which showed an underlying rhythm of: sinus rhythm.  Additional history obtained: External records from outside source obtained and reviewed including: Chart review including previous notes, labs, imaging.  Disposition Patient care signed out at shift change to Montine Circle, PA-C This chart was dictated using voice recognition software.  Despite best efforts to proofread,  errors can occur which can change the documentation meaning.    {Document critical care time when appropriate:1} {Document review of labs and clinical decision tools ie heart score, Chads2Vasc2 etc:1}  {Document your independent review of radiology images, and any outside records:1} {Document your discussion with family members, caretakers, and with consultants:1} {Document social determinants of health affecting pt's care:1} {Document your decision making why or why not admission, treatments were needed:1} Final Clinical Impression(s) / ED Diagnoses Final diagnoses:  None    Rx / DC Orders ED Discharge Orders     None

## 2022-05-13 NOTE — ED Triage Notes (Signed)
Patient arrived with complaints of fever, body aches, and nausea since yesterday. Last dose of Advil two hours ago.

## 2022-05-13 NOTE — ED Provider Notes (Signed)
Patient here with fever, n/v/d.  Onset yesterday.  Physical Exam  BP 135/83   Pulse (!) 128   Temp (!) 101.2 F (38.4 C) (Oral)   Resp 20   Ht 6' 3"$  (1.905 m)   Wt 117.9 kg   SpO2 92%   BMI 32.50 kg/m   Physical Exam  Procedures  Procedures  ED Course / MDM   Clinical Course as of 05/13/22 2351  Sun May 13, 2022  2217 I evaluated personally at bedside.  Patient with 1 day of illness.  Its diffuse nature illness.  He is having left upper quadrant pain, some vague cough like symptoms.  He is also endorsing nausea without vomiting .  Poor p.o. intake today. [CC]    Clinical Course User Index [CC] Tretha Sciara, MD   Medical Decision Making Amount and/or Complexity of Data Reviewed Labs: ordered. Radiology: ordered. ECG/medicine tests: ordered.  Risk OTC drugs. Prescription drug management.   ***

## 2022-05-14 ENCOUNTER — Encounter (HOSPITAL_COMMUNITY): Payer: Self-pay

## 2022-05-14 ENCOUNTER — Emergency Department (HOSPITAL_COMMUNITY): Payer: Self-pay

## 2022-05-14 LAB — RESPIRATORY PANEL BY PCR

## 2022-05-14 LAB — URINALYSIS, ROUTINE W REFLEX MICROSCOPIC
Bacteria, UA: NONE SEEN
Bilirubin Urine: NEGATIVE
Glucose, UA: NEGATIVE mg/dL
Ketones, ur: NEGATIVE mg/dL
Leukocytes,Ua: NEGATIVE
Nitrite: NEGATIVE
Protein, ur: 30 mg/dL — AB
Specific Gravity, Urine: 1.018 (ref 1.005–1.030)
pH: 5 (ref 5.0–8.0)

## 2022-05-14 MED ORDER — IOHEXOL 300 MG/ML  SOLN
100.0000 mL | Freq: Once | INTRAMUSCULAR | Status: AC | PRN
  Administered 2022-05-14: 100 mL via INTRAVENOUS

## 2022-05-14 MED ORDER — ONDANSETRON 4 MG PO TBDP: 4.0000 mg | ORAL_TABLET | Freq: Three times a day (TID) | ORAL | 0 refills | Status: AC | PRN

## 2022-05-14 MED ORDER — OMEPRAZOLE 20 MG PO CPDR: 20.0000 mg | DELAYED_RELEASE_CAPSULE | Freq: Every day | ORAL | 0 refills | Status: AC

## 2022-05-14 NOTE — Discharge Instructions (Signed)
It's unclear the cause of your symptoms.  Please monitor your symptoms closely.  If you worsen, you need to return to the ER.  Otherwise, please follow-up with your regular doctor.    We will call you if the blood cultures are positive for bacteria.  No news is good news.  I've prescribed you nausea medicine and medicine for acid reflux.  Your CT scan showed signs of esophagitis.  Please follow-up with a gastroenterologist for this.

## 2022-05-19 LAB — CULTURE, BLOOD (ROUTINE X 2)
Culture: NO GROWTH
Culture: NO GROWTH
Special Requests: ADEQUATE
Special Requests: ADEQUATE

## 2022-06-02 ENCOUNTER — Encounter (HOSPITAL_COMMUNITY): Payer: Self-pay

## 2022-06-02 ENCOUNTER — Emergency Department (HOSPITAL_COMMUNITY)
Admission: EM | Admit: 2022-06-02 | Discharge: 2022-06-02 | Disposition: A | Discharge status: Home / Self Care | Payer: Self-pay | Attending: Emergency Medicine | Admitting: Emergency Medicine

## 2022-06-02 ENCOUNTER — Emergency Department (HOSPITAL_COMMUNITY): Payer: Self-pay

## 2022-06-02 DIAGNOSIS — M7989 Other specified soft tissue disorders: Secondary | ICD-10-CM | POA: Insufficient documentation

## 2022-06-02 DIAGNOSIS — I10 Essential (primary) hypertension: Secondary | ICD-10-CM | POA: Insufficient documentation

## 2022-06-02 DIAGNOSIS — M79671 Pain in right foot: Secondary | ICD-10-CM | POA: Insufficient documentation

## 2022-06-02 DIAGNOSIS — Z79899 Other long term (current) drug therapy: Secondary | ICD-10-CM | POA: Insufficient documentation

## 2022-06-02 MED ORDER — NAPROXEN 500 MG PO TABS
500.0000 mg | ORAL_TABLET | Freq: Once | ORAL | Status: AC
  Administered 2022-06-02: 500 mg via ORAL
  Filled 2022-06-02: qty 1

## 2022-06-02 MED ORDER — DOXYCYCLINE HYCLATE 100 MG PO CAPS: 100.0000 mg | ORAL_CAPSULE | Freq: Two times a day (BID) | ORAL | 0 refills | Status: AC

## 2022-06-02 MED ORDER — NAPROXEN 500 MG PO TABS: 500.0000 mg | ORAL_TABLET | Freq: Two times a day (BID) | ORAL | 0 refills | Status: AC

## 2022-06-02 NOTE — ED Notes (Signed)
AVS with prescriptions provided to and discussed with patient and family member at bedside. Pt verbalizes understanding of discharge instructions and denies any questions or concerns at this time. Pt ambulated out of department independently with steady gait.

## 2022-06-02 NOTE — ED Triage Notes (Signed)
Pt states that he has pain in his R foot due to a bunion

## 2022-06-02 NOTE — ED Provider Notes (Signed)
[place] EMERGENCY DEPARTMENT AT [facility] Provider Note   CSN: [id number] Arrival date & time: 06/02/22  [id number]     History  Chief Complaint  Patient presents with   Foot Pain    Richard Bautista is a 52 y.o. male.  Patient presents emergency room complaining of right-sided foot pain with no known trauma or injury.  He has pain to the medial foot near the first MTP joint.  He complains of mild swelling in the area.  He has not seen orthopedics for this problem has tried no outpatient treatment.  Past medical history significant for hypertension, obesity  HPI     Home Medications Prior to Admission medications   Medication Sig Start Date End Date Taking? Authorizing Provider  doxycycline (VIBRAMYCIN) 100 MG capsule Take 1 capsule (100 mg total) by mouth 2 (two) times daily. 06/02/22  Yes Dorothyann Peng, PA-C  naproxen (NAPROSYN) 500 MG tablet Take 1 tablet (500 mg total) by mouth 2 (two) times daily. 06/02/22  Yes Dorothyann Peng, PA-C  benzonatate (TESSALON) 100 MG capsule Take 1 capsule (100 mg total) by mouth 3 (three) times daily as needed for cough. Patient not taking: Reported on 08/26/2021 04/02/21   [name], Debe Coder, MD  calamine lotion Apply 1 application. topically 3 (three) times daily as needed for itching.    [provider]  famotidine (PEPCID) 20 MG tablet Take 1 tablet (20 mg total) by mouth daily. 08/26/21   Deatra Canter, Amjad, PA-C  losartan (COZAAR) 50 MG tablet Take 1 tablet (50 mg total) by mouth daily. Patient not taking: Reported on 08/26/2021 12/29/18   Forrest Moron, MD  omeprazole (PRILOSEC) 20 MG capsule Take 1 capsule (20 mg total) by mouth daily. 05/14/22   Montine Circle, PA-C  ondansetron (ZOFRAN-ODT) 4 MG disintegrating tablet Take 1 tablet (4 mg total) by mouth every 8 (eight) hours as needed for nausea or vomiting. 05/14/22   Montine Circle, PA-C  OVER THE COUNTER MEDICATION Take 1 capsule by mouth daily. Seamoss    [provider]   OVER THE COUNTER MEDICATION Take 1 capsule by mouth daily. Elderberry.    [provider]  OVER THE COUNTER MEDICATION Take 1 Pump by mouth daily. Isotonix.    [provider]  pravastatin (PRAVACHOL) 20 MG tablet Take 1 tablet (20 mg total) by mouth daily. Patient not taking: Reported on 08/26/2021 01/08/19   Forrest Moron, MD  predniSONE (STERAPRED UNI-PAK 21 TAB) 10 MG (21) TBPK tablet Take by mouth daily. Take 6 tabs by mouth daily  for 2 days, then 5 tabs for 2 days, then 4 tabs for 2 days, then 3 tabs for 2 days, 2 tabs for 2 days, then 1 tab by mouth daily for 2 days 08/26/21   Evlyn Courier, PA-C      Allergies    Benadryl [diphenhydramine], Erythromycin, Other, and Poison ivy extract    Review of Systems   Review of Systems  Musculoskeletal:  Positive for arthralgias.    Physical Exam Updated Vital Signs BP (!) 159/90   Pulse 71   Temp 98.2 F (36.8 C) (Oral)   Resp 18   SpO2 95%  Physical Exam Vitals reviewed.  HENT:     Head: Normocephalic and atraumatic.  Eyes:     Pupils: Pupils are equal, round, and reactive to light.  Cardiovascular:     Rate and Rhythm: Normal rate.  Pulmonary:     Effort: Pulmonary effort is normal.  No respiratory distress.  Musculoskeletal:        General: Normal range of motion.     Cervical back: Normal range of motion.     Comments: Mild swelling/deformity noted to the medial portion of the first MTP of the right foot.  No erythema or warmth noted.  Skin:    General: Skin is warm and dry.     Findings: Erythema present.     Comments: Mild area of erythema around left MCP  Neurological:     Mental Status: He is alert.  Psychiatric:        Speech: Speech normal.        Behavior: Behavior normal.     ED Results / Procedures / Treatments   Labs (all labs ordered are listed, but only abnormal results are displayed) Labs Reviewed - No data to display  EKG None  Radiology DG Foot Complete Right  Result Date:  06/02/2022 CLINICAL DATA:  52 year old male with history of right-sided foot pain. EXAM: RIGHT FOOT COMPLETE - 3+ VIEW COMPARISON:  No priors. FINDINGS: Three views of the right foot demonstrate no acute displaced fracture, subluxation or dislocation. There is joint space narrowing, subchondral sclerosis, subchondral cyst formation and osteophyte formation at the first MTP joint, indicative of osteoarthritis. IMPRESSION: 1. No acute radiographic abnormality of the right foot. 2. 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 Electronically Signed   By: Vinnie Langton M.D.   On: 06/02/2022 07:24    Procedures Procedures    Medications Ordered in ED Medications  naproxen (NAPROSYN) tablet 500 mg (500 mg Oral Given 06/02/22 0732)    ED Course/ Medical Decision Making/ A&P                             Medical Decision Making Amount and/or Complexity of Data Reviewed Radiology: ordered.  Risk Prescription drug management.   The patient presents with chief complaint of right foot pain.  Differential includes bunion, fracture, dislocation, soft tissue injury, others  I ordered the patient Naprosyn for his inflammation.  Upon reassessment the patient was feeling somewhat better.  I made adjustments as needed to the patient's home medications.  I ordered and interpreted imaging including plain films of the right foot.  Significant osteoarthritis noted at the first MTP joint.  I agree with the radiologist findings  The patient does have some redness around the joint.  Skin changes appear consistent with cellulitis.  No fevers or systemic symptoms to suggest septic arthritis at this time.  The patient also has arthritic changes in that toe.  Plan to discharge home with both anti-inflammatory medications and antibiotics.  The patient's erythema was outlined with a marker and the patient was instructed to return for further evaluation if the erythema swells up outside of these boundaries.  Patient  will otherwise follow-up with orthopedics for his arthritis.       Final Clinical Impression(s) / ED Diagnoses Final diagnoses:  Right foot pain    Rx / DC Orders ED Discharge Orders          Ordered    naproxen (NAPROSYN) 500 MG tablet  2 times daily        06/02/22 0732    doxycycline (VIBRAMYCIN) 100 MG capsule  2 times daily        06/02/22 [id number]              Dorothyann Peng, PA-C 06/02/22 0800  Wyvonnia Dusky, MD 06/02/22 [phone]

## 2022-06-02 NOTE — Discharge Instructions (Addendum)
You were evaluated today for right foot pain.  You have arthritis in the great toe of the right foot.  You also have skin changes that look consistent with cellulitis, or a skin infection.  I have prescribed antibiotics for the cellulitis.  Please consider buying over the counter "bunion" pads to see if they help alleviate some of your joint symptoms. I have attached contact information for orthopedics for a follow up appointment as needed. I have also prescribed antiinflammatory medication to be taken as directed.   If the redness worsens, spreading outside of the current boundaries which were outlined by the nursing staff, please return to the emergency department for further evaluation.  Please be sure to complete the entire course of antibiotics.

## 2022-10-16 ENCOUNTER — Encounter: Payer: Self-pay | Admitting: Family Medicine

## 2022-10-16 ENCOUNTER — Ambulatory Visit (INDEPENDENT_AMBULATORY_CARE_PROVIDER_SITE_OTHER): Payer: Self-pay | Admitting: Family Medicine

## 2022-10-16 VITALS — BP 155/94 | HR 67 | Temp 98.1°F | Resp 16 | Ht 75.0 in | Wt 259.2 lb

## 2022-10-16 DIAGNOSIS — Z7689 Persons encountering health services in other specified circumstances: Secondary | ICD-10-CM

## 2022-10-16 DIAGNOSIS — K219 Gastro-esophageal reflux disease without esophagitis: Secondary | ICD-10-CM

## 2022-10-16 DIAGNOSIS — E785 Hyperlipidemia, unspecified: Secondary | ICD-10-CM

## 2022-10-16 DIAGNOSIS — I10 Essential (primary) hypertension: Secondary | ICD-10-CM

## 2022-10-16 MED ORDER — LOSARTAN POTASSIUM 50 MG PO TABS: 50.0000 mg | ORAL_TABLET | Freq: Every day | ORAL | 0 refills | Status: DC

## 2022-10-16 NOTE — Progress Notes (Unsigned)
Patient is here to established care with provider today. Patient has many health concern they would like to discuss with provider today  Care gaps discuss at appointment today  

## 2022-10-18 ENCOUNTER — Encounter: Payer: Self-pay | Admitting: Family Medicine

## 2022-10-18 LAB — CMP14+EGFR
ALT: 10 IU/L (ref 0–44)
AST: 23 IU/L (ref 0–40)
Albumin: 4.5 g/dL (ref 3.8–4.9)
BUN: 14 mg/dL (ref 6–24)
Bilirubin Total: 0.3 mg/dL (ref 0.0–1.2)
Calcium: 9.4 mg/dL (ref 8.7–10.2)
Chloride: 103 mmol/L (ref 96–106)
Creatinine, Ser: 1.04 mg/dL (ref 0.76–1.27)
Globulin, Total: 2.6 g/dL (ref 1.5–4.5)
Glucose: 95 mg/dL (ref 70–99)
Potassium: 4.4 mmol/L (ref 3.5–5.2)
Sodium: 141 mmol/L (ref 134–144)
Total Protein: 7.1 g/dL (ref 6.0–8.5)

## 2022-10-18 LAB — LIPID PANEL
Cholesterol, Total: 205 mg/dL — ABNORMAL HIGH (ref 100–199)
LDL Chol Calc (NIH): 126 mg/dL — ABNORMAL HIGH (ref 0–99)
Triglycerides: 106 mg/dL (ref 0–149)
VLDL Cholesterol Cal: 19 mg/dL (ref 5–40)

## 2022-10-18 LAB — SPECIMEN STATUS REPORT

## 2022-10-18 NOTE — Progress Notes (Signed)
New Patient Office Visit  Subjective    Patient ID: Richard Bautista, male    DOB: 18-Oct-1970  Age: 52 y.o. MRN: [id number]  CC:  Chief Complaint  Patient presents with   Establish Care    HPI Richard Bautista presents to establish care and for review of chronic med issues. Patient denies acute complaints or concerns.    Outpatient Encounter Medications as of 10/16/2022  Medication Sig   calamine lotion Apply 1 application. topically 3 (three) times daily as needed for itching.   doxycycline (VIBRAMYCIN) 100 MG capsule Take 1 capsule (100 mg total) by mouth 2 (two) times daily.   famotidine (PEPCID) 20 MG tablet Take 1 tablet (20 mg total) by mouth daily.   naproxen (NAPROSYN) 500 MG tablet Take 1 tablet (500 mg total) by mouth 2 (two) times daily.   omeprazole (PRILOSEC) 20 MG capsule Take 1 capsule (20 mg total) by mouth daily.   ondansetron (ZOFRAN-ODT) 4 MG disintegrating tablet Take 1 tablet (4 mg total) by mouth every 8 (eight) hours as needed for nausea or vomiting.   OVER THE COUNTER MEDICATION Take 1 capsule by mouth daily. Seamoss   OVER THE COUNTER MEDICATION Take 1 capsule by mouth daily. Elderberry.   OVER THE COUNTER MEDICATION Take 1 Pump by mouth daily. Isotonix.   predniSONE (STERAPRED UNI-PAK 21 TAB) 10 MG (21) TBPK tablet Take by mouth daily. Take 6 tabs by mouth daily  for 2 days, then 5 tabs for 2 days, then 4 tabs for 2 days, then 3 tabs for 2 days, 2 tabs for 2 days, then 1 tab by mouth daily for 2 days   benzonatate (TESSALON) 100 MG capsule Take 1 capsule (100 mg total) by mouth 3 (three) times daily as needed for cough. (Patient not taking: Reported on 08/26/2021)   losartan (COZAAR) 50 MG tablet Take 1 tablet (50 mg total) by mouth daily.   pravastatin (PRAVACHOL) 20 MG tablet Take 1 tablet (20 mg total) by mouth daily. (Patient not taking: Reported on 08/26/2021)   [DISCONTINUED] losartan (COZAAR) 50 MG tablet Take 1 tablet (50 mg total) by mouth daily. (Patient not  taking: Reported on 08/26/2021)   No facility-administered encounter medications on file as of 10/16/2022.    Past Medical History:  Diagnosis Date   Blind one eye    right    Detached retina    Knee injury    Pneumonia     Past Surgical History:  Procedure Laterality Date   EYE SURGERY      No family history on file.  Social History   Socioeconomic History   Marital status: Married    Spouse name: Not on file   Number of children: Not on file   Years of education: Not on file   Highest education level: Not on file  Occupational History   Not on file  Tobacco Use   Smoking status: Former    Types: Cigars    Quit date: 02/18/2018    Years since quitting: 4.6   Smokeless tobacco: Never  Vaping Use   Vaping status: Never Used  Substance and Sexual Activity   Alcohol use: Yes    Comment: socially   Drug use: No   Sexual activity: Not on file  Other Topics Concern   Not on file  Social History Narrative   Not on file   Social Determinants of Health   Financial Resource Strain: Not on file  Food Insecurity: Not on  file  Transportation Needs: Not on file  Physical Activity: Not on file  Stress: Not on file  Social Connections: Not on file  Intimate Partner Violence: Not on file    Review of Systems  All other systems reviewed and are negative.       Objective    BP (!) 155/94   Pulse 67   Temp 98.1 F (36.7 C) (Oral)   Resp 16   Ht 6\' 3"  (1.905 m)   Wt 259 lb 3.2 oz (117.6 kg)   SpO2 96%   BMI 32.40 kg/m   Physical Exam Vitals and nursing note reviewed.  Constitutional:      General: He is not in acute distress. Cardiovascular:     Rate and Rhythm: Normal rate and regular rhythm.  Pulmonary:     Effort: Pulmonary effort is normal.     Breath sounds: Normal breath sounds.  Abdominal:     Palpations: Abdomen is soft.     Tenderness: There is no abdominal tenderness.  Neurological:     General: No focal deficit present.     Mental  Status: He is alert and oriented to person, place, and time.         Assessment & Plan:   1. Essential hypertension Slightly elevated readings. Losartan 50 mg prescribed. Monitoring labs ordered - CMP14+EGFR - Lipid Panel  2. Hyperlipidemia, unspecified hyperlipidemia type Continue   3. Gastroesophageal reflux disease without esophagitis Discussed dietary and activity options.   4. Encounter to establish care     Return in about 4 weeks (around 11/13/2022) for follow up.   Richard Raymond, MD

## 2022-11-14 ENCOUNTER — Ambulatory Visit (INDEPENDENT_AMBULATORY_CARE_PROVIDER_SITE_OTHER): Payer: Self-pay | Admitting: Family Medicine

## 2022-11-14 ENCOUNTER — Encounter: Payer: Self-pay | Admitting: Family Medicine

## 2022-11-14 VITALS — BP 167/84 | HR 80 | Temp 98.7°F | Resp 16 | Ht 75.0 in | Wt 258.8 lb

## 2022-11-14 DIAGNOSIS — I10 Essential (primary) hypertension: Secondary | ICD-10-CM

## 2022-11-14 NOTE — Progress Notes (Signed)
Patient seen by another MD and he has a enlarge esophagus.  Rash on stomach for about a month. Patient wants blood pressure check.

## 2022-11-14 NOTE — Progress Notes (Signed)
Established Patient Office Visit  Subjective    Patient ID: Richard Bautista, male    DOB: 07-18-1970  Age: 52 y.o. MRN: [id number]  CC:  Chief Complaint  Patient presents with   Follow-up    Patient seen by another MD and he has a enlarge esophagus.  Rash on stomach for about a month.  Patient wants  blood pressure check    HPI Richard Bautista presents for follow up of chronic med issues. Patient denies acute complaints.    Outpatient Encounter Medications as of 11/14/2022  Medication Sig   benzonatate (TESSALON) 100 MG capsule Take 1 capsule (100 mg total) by mouth 3 (three) times daily as needed for cough. (Patient not taking: Reported on 08/26/2021)   calamine lotion Apply 1 application. topically 3 (three) times daily as needed for itching.   doxycycline (VIBRAMYCIN) 100 MG capsule Take 1 capsule (100 mg total) by mouth 2 (two) times daily.   famotidine (PEPCID) 20 MG tablet Take 1 tablet (20 mg total) by mouth daily.   losartan (COZAAR) 50 MG tablet Take 1 tablet (50 mg total) by mouth daily.   naproxen (NAPROSYN) 500 MG tablet Take 1 tablet (500 mg total) by mouth 2 (two) times daily.   omeprazole (PRILOSEC) 20 MG capsule Take 1 capsule (20 mg total) by mouth daily.   ondansetron (ZOFRAN-ODT) 4 MG disintegrating tablet Take 1 tablet (4 mg total) by mouth every 8 (eight) hours as needed for nausea or vomiting.   OVER THE COUNTER MEDICATION Take 1 capsule by mouth daily. Seamoss   OVER THE COUNTER MEDICATION Take 1 capsule by mouth daily. Elderberry.   OVER THE COUNTER MEDICATION Take 1 Pump by mouth daily. Isotonix.   pravastatin (PRAVACHOL) 20 MG tablet Take 1 tablet (20 mg total) by mouth daily. (Patient not taking: Reported on 08/26/2021)   predniSONE (STERAPRED UNI-PAK 21 TAB) 10 MG (21) TBPK tablet Take by mouth daily. Take 6 tabs by mouth daily  for 2 days, then 5 tabs for 2 days, then 4 tabs for 2 days, then 3 tabs for 2 days, 2 tabs for 2 days, then 1 tab by mouth daily for 2  days   No facility-administered encounter medications on file as of 11/14/2022.    Past Medical History:  Diagnosis Date   Blind one eye    right    Detached retina    Knee injury    Pneumonia     Past Surgical History:  Procedure Laterality Date   EYE SURGERY      History reviewed. No pertinent family history.  Social History   Socioeconomic History   Marital status: Married    Spouse name: Not on file   Number of children: Not on file   Years of education: Not on file   Highest education level: Not on file  Occupational History   Not on file  Tobacco Use   Smoking status: Former    Types: Cigars    Quit date: 02/18/2018    Years since quitting: 4.7   Smokeless tobacco: Never  Vaping Use   Vaping status: Never Used  Substance and Sexual Activity   Alcohol use: Yes    Comment: socially   Drug use: No   Sexual activity: Not on file  Other Topics Concern   Not on file  Social History Narrative   Not on file   Social Determinants of Health   Financial Resource Strain: Not on file  Food Insecurity: Not  on file  Transportation Needs: Not on file  Physical Activity: Not on file  Stress: Not on file  Social Connections: Not on file  Intimate Partner Violence: Not on file    Review of Systems  All other systems reviewed and are negative.       Objective    BP (!) 167/84 (BP Location: Right Arm, Patient Position: Sitting, Cuff Size: Large)   Pulse 80   Temp 98.7 F (37.1 C) (Oral)   Resp 16   Ht 6\' 3"  (1.905 m)   Wt 258 lb 12.8 oz (117.4 kg)   SpO2 95%   BMI 32.35 kg/m   Physical Exam Vitals and nursing note reviewed.  Constitutional:      General: He is not in acute distress. Cardiovascular:     Rate and Rhythm: Normal rate and regular rhythm.  Pulmonary:     Effort: Pulmonary effort is normal.     Breath sounds: Normal breath sounds.  Abdominal:     Palpations: Abdomen is soft.     Tenderness: There is no abdominal tenderness.   Neurological:     General: No focal deficit present.     Mental Status: He is alert and oriented to person, place, and time.         Assessment & Plan:   1. Essential hypertension Patient with elevated readings. Patient defers changes in management. Reviewed dietary and activity options.    Return in about 4 weeks (around 12/12/2022) for follow up, chronic med issues.   [name], MD

## 2022-12-07 ENCOUNTER — Encounter: Payer: Self-pay | Admitting: Pharmacist

## 2022-12-12 ENCOUNTER — Ambulatory Visit: Payer: Self-pay | Admitting: Family Medicine

## 2022-12-19 ENCOUNTER — Ambulatory Visit: Payer: Self-pay | Admitting: Family Medicine

## 2023-01-11 ENCOUNTER — Encounter: Payer: Self-pay | Admitting: Family Medicine

## 2023-01-11 ENCOUNTER — Ambulatory Visit (INDEPENDENT_AMBULATORY_CARE_PROVIDER_SITE_OTHER): Payer: Self-pay | Admitting: Family Medicine

## 2023-01-11 VITALS — BP 155/90 | HR 72 | Temp 98.7°F | Resp 18 | Ht 75.0 in | Wt 265.4 lb

## 2023-01-11 DIAGNOSIS — I10 Essential (primary) hypertension: Secondary | ICD-10-CM

## 2023-01-11 DIAGNOSIS — K219 Gastro-esophageal reflux disease without esophagitis: Secondary | ICD-10-CM

## 2023-01-11 MED ORDER — LOSARTAN POTASSIUM 100 MG PO TABS: 100.0000 mg | ORAL_TABLET | Freq: Every day | ORAL | 0 refills | Status: AC

## 2023-01-11 NOTE — Progress Notes (Signed)
Established Patient Office Visit  Subjective    Patient ID: Richard Bautista, male    DOB: Sep 01, 1970  Age: 52 y.o. MRN: [id number]  CC:  Chief Complaint  Patient presents with   Follow-up    B/p    HPI Richard Bautista presents for follow u pof hypertension. Patient has been taking meds. He denies acute complaints.   Outpatient Encounter Medications as of 01/11/2023  Medication Sig   calamine lotion Apply 1 application. topically 3 (three) times daily as needed for itching.   doxycycline (VIBRAMYCIN) 100 MG capsule Take 1 capsule (100 mg total) by mouth 2 (two) times daily.   famotidine (PEPCID) 20 MG tablet Take 1 tablet (20 mg total) by mouth daily.   losartan (COZAAR) 100 MG tablet Take 1 tablet (100 mg total) by mouth daily.   naproxen (NAPROSYN) 500 MG tablet Take 1 tablet (500 mg total) by mouth 2 (two) times daily.   omeprazole (PRILOSEC) 20 MG capsule Take 1 capsule (20 mg total) by mouth daily.   ondansetron (ZOFRAN-ODT) 4 MG disintegrating tablet Take 1 tablet (4 mg total) by mouth every 8 (eight) hours as needed for nausea or vomiting.   OVER THE COUNTER MEDICATION Take 1 capsule by mouth daily. Seamoss   OVER THE COUNTER MEDICATION Take 1 capsule by mouth daily. Elderberry.   OVER THE COUNTER MEDICATION Take 1 Pump by mouth daily. Isotonix.   predniSONE (STERAPRED UNI-PAK 21 TAB) 10 MG (21) TBPK tablet Take by mouth daily. Take 6 tabs by mouth daily  for 2 days, then 5 tabs for 2 days, then 4 tabs for 2 days, then 3 tabs for 2 days, 2 tabs for 2 days, then 1 tab by mouth daily for 2 days   [DISCONTINUED] losartan (COZAAR) 50 MG tablet Take 1 tablet (50 mg total) by mouth daily.   benzonatate (TESSALON) 100 MG capsule Take 1 capsule (100 mg total) by mouth 3 (three) times daily as needed for cough. (Patient not taking: Reported on 08/26/2021)   pravastatin (PRAVACHOL) 20 MG tablet Take 1 tablet (20 mg total) by mouth daily. (Patient not taking: Reported on 08/26/2021)   No  facility-administered encounter medications on file as of 01/11/2023.    Past Medical History:  Diagnosis Date   Blind one eye    right    Detached retina    Knee injury    Pneumonia     Past Surgical History:  Procedure Laterality Date   EYE SURGERY      History reviewed. No pertinent family history.  Social History   Socioeconomic History   Marital status: Married    Spouse name: Not on file   Number of children: Not on file   Years of education: Not on file   Highest education level: Not on file  Occupational History   Not on file  Tobacco Use   Smoking status: Former    Types: Cigars    Quit date: 02/18/2018    Years since quitting: 4.8   Smokeless tobacco: Never  Vaping Use   Vaping status: Never Used  Substance and Sexual Activity   Alcohol use: Yes    Comment: socially   Drug use: No   Sexual activity: Not on file  Other Topics Concern   Not on file  Social History Narrative   Not on file   Social Determinants of Health   Financial Resource Strain: Low Risk  (01/11/2023)   Overall Financial Resource Strain (CARDIA)  Difficulty of Paying Living Expenses: Not hard at all  Food Insecurity: No Food Insecurity (01/11/2023)   Hunger Vital Sign    Worried About Running Out of Food in the Last Year: Never true    Ran Out of Food in the Last Year: Never true  Transportation Needs: No Transportation Needs (01/11/2023)   PRAPARE - Administrator, Civil Service (Medical): No    Lack of Transportation (Non-Medical): No  Physical Activity: Sufficiently Active (01/11/2023)   Exercise Vital Sign    Days of Exercise per Week: 5 days    Minutes of Exercise per Session: 30 min  Stress: No Stress Concern Present (01/11/2023)   [employer] of Occupational Health - Occupational Stress Questionnaire    Feeling of Stress : Not at all  Social Connections: Socially Integrated (01/11/2023)   Social Connection and Isolation Panel [NHANES]     Frequency of Communication with Friends and Family: More than three times a week    Frequency of Social Gatherings with Friends and Family: More than three times a week    Attends Religious Services: More than 4 times per year    Active Member of [employer] or Organizations: Yes    Attends Banker Meetings: 1 to 4 times per year    Marital Status: Married  Catering manager Violence: Not At Risk (01/11/2023)   Humiliation, Afraid, Rape, and Kick questionnaire    Fear of Current or Ex-Partner: No    Emotionally Abused: No    Physically Abused: No    Sexually Abused: No    Review of Systems  All other systems reviewed and are negative.       Objective    BP (!) 155/90 (BP Location: Right Arm, Patient Position: Sitting, Cuff Size: Large)   Pulse 72   Temp 98.7 F (37.1 C) (Oral)   Resp 18   Ht 6\' 3"  (1.905 m)   Wt 265 lb 6.4 oz (120.4 kg)   SpO2 96%   BMI 33.17 kg/m   Physical Exam Vitals and nursing note reviewed.  Constitutional:      General: He is not in acute distress. Cardiovascular:     Rate and Rhythm: Normal rate and regular rhythm.  Pulmonary:     Effort: Pulmonary effort is normal.     Breath sounds: Normal breath sounds.  Abdominal:     Palpations: Abdomen is soft.     Tenderness: There is no abdominal tenderness.  Neurological:     General: No focal deficit present.     Mental Status: He is alert and oriented to person, place, and time.         Assessment & Plan:   1. Essential hypertension Slightly elevated readings will increase losartan from 50 mg to 100mg  daily andmonito  2. Gastroesophageal reflux disease without esophagitis Appears stable. No sx. No longer taking meds.   [name], MD

## 2023-02-12 ENCOUNTER — Encounter: Payer: Self-pay | Admitting: Family Medicine

## 2023-02-12 ENCOUNTER — Ambulatory Visit (INDEPENDENT_AMBULATORY_CARE_PROVIDER_SITE_OTHER): Payer: Self-pay | Admitting: Family Medicine

## 2023-02-12 VITALS — BP 156/97 | HR 85 | Temp 98.9°F | Resp 18 | Ht 75.0 in | Wt 268.2 lb

## 2023-02-12 DIAGNOSIS — I10 Essential (primary) hypertension: Secondary | ICD-10-CM

## 2023-02-12 MED ORDER — HYDROCHLOROTHIAZIDE 25 MG PO TABS: 25.0000 mg | ORAL_TABLET | Freq: Every day | ORAL | 0 refills | Status: AC

## 2023-02-14 ENCOUNTER — Encounter: Payer: Self-pay | Admitting: Family Medicine

## 2023-02-14 NOTE — Progress Notes (Signed)
Established Patient Office Visit  Subjective    Patient ID: Richard Bautista, male    DOB: 1971-01-13  Age: 52 y.o. MRN: [id number]  CC:  Chief Complaint  Patient presents with   Follow-up    HPI [name] presents for follow up of hypertension. Patient reports that he has been taking meds as recommended. Patient denies acute complaints.   Outpatient Encounter Medications as of 02/12/2023  Medication Sig   calamine lotion Apply 1 application. topically 3 (three) times daily as needed for itching.   doxycycline (VIBRAMYCIN) 100 MG capsule Take 1 capsule (100 mg total) by mouth 2 (two) times daily.   famotidine (PEPCID) 20 MG tablet Take 1 tablet (20 mg total) by mouth daily.   hydrochlorothiazide (HYDRODIURIL) 25 MG tablet Take 1 tablet (25 mg total) by mouth daily.   losartan (COZAAR) 100 MG tablet Take 1 tablet (100 mg total) by mouth daily.   naproxen (NAPROSYN) 500 MG tablet Take 1 tablet (500 mg total) by mouth 2 (two) times daily.   omeprazole (PRILOSEC) 20 MG capsule Take 1 capsule (20 mg total) by mouth daily.   ondansetron (ZOFRAN-ODT) 4 MG disintegrating tablet Take 1 tablet (4 mg total) by mouth every 8 (eight) hours as needed for nausea or vomiting.   OVER THE COUNTER MEDICATION Take 1 capsule by mouth daily. Seamoss   OVER THE COUNTER MEDICATION Take 1 capsule by mouth daily. Elderberry.   predniSONE (STERAPRED UNI-PAK 21 TAB) 10 MG (21) TBPK tablet Take by mouth daily. Take 6 tabs by mouth daily  for 2 days, then 5 tabs for 2 days, then 4 tabs for 2 days, then 3 tabs for 2 days, 2 tabs for 2 days, then 1 tab by mouth daily for 2 days   benzonatate (TESSALON) 100 MG capsule Take 1 capsule (100 mg total) by mouth 3 (three) times daily as needed for cough. (Patient not taking: Reported on 02/12/2023)   OVER THE COUNTER MEDICATION Take 1 Pump by mouth daily. Isotonix.   pravastatin (PRAVACHOL) 20 MG tablet Take 1 tablet (20 mg total) by mouth daily. (Patient not taking:  Reported on 08/26/2021)   No facility-administered encounter medications on file as of 02/12/2023.    Past Medical History:  Diagnosis Date   Blind one eye    right    Detached retina    Knee injury    Pneumonia     Past Surgical History:  Procedure Laterality Date   EYE SURGERY      History reviewed. No pertinent family history.  Social History   Socioeconomic History   Marital status: Married    Spouse name: Not on file   Number of children: Not on file   Years of education: Not on file   Highest education level: Not on file  Occupational History   Not on file  Tobacco Use   Smoking status: Former    Types: Cigars    Quit date: 02/18/2018    Years since quitting: 4.9   Smokeless tobacco: Never  Vaping Use   Vaping status: Never Used  Substance and Sexual Activity   Alcohol use: Yes    Comment: socially   Drug use: No   Sexual activity: Not on file  Other Topics Concern   Not on file  Social History Narrative   Not on file   Social Determinants of Health   Financial Resource Strain: Low Risk  (01/11/2023)   Overall Financial Resource Strain (CARDIA)  Difficulty of Paying Living Expenses: Not hard at all  Food Insecurity: No Food Insecurity (01/11/2023)   Hunger Vital Sign    Worried About Running Out of Food in the Last Year: Never true    Ran Out of Food in the Last Year: Never true  Transportation Needs: No Transportation Needs (01/11/2023)   PRAPARE - Administrator, Civil Service (Medical): No    Lack of Transportation (Non-Medical): No  Physical Activity: Sufficiently Active (01/11/2023)   Exercise Vital Sign    Days of Exercise per Week: 5 days    Minutes of Exercise per Session: 30 min  Stress: No Stress Concern Present (01/11/2023)   [employer] of Occupational Health - Occupational Stress Questionnaire    Feeling of Stress : Not at all  Social Connections: Socially Integrated (01/11/2023)   Social Connection and  Isolation Panel [NHANES]    Frequency of Communication with Friends and Family: More than three times a week    Frequency of Social Gatherings with Friends and Family: More than three times a week    Attends Religious Services: More than 4 times per year    Active Member of [employer] or Organizations: Yes    Attends Banker Meetings: 1 to 4 times per year    Marital Status: Married  Catering manager Violence: Not At Risk (01/11/2023)   Humiliation, Afraid, Rape, and Kick questionnaire    Fear of Current or Ex-Partner: No    Emotionally Abused: No    Physically Abused: No    Sexually Abused: No    Review of Systems  All other systems reviewed and are negative.       Objective    BP (!) 156/97 (BP Location: Left Arm, Patient Position: Sitting, Cuff Size: Large)   Pulse 85   Temp 98.9 F (37.2 C) (Oral)   Resp 18   Ht 6\' 3"  (1.905 m)   Wt 268 lb 3.2 oz (121.7 kg)   SpO2 95%   BMI 33.52 kg/m   Physical Exam Vitals and nursing note reviewed.  Constitutional:      General: He is not in acute distress. Cardiovascular:     Rate and Rhythm: Normal rate and regular rhythm.  Pulmonary:     Effort: Pulmonary effort is normal.     Breath sounds: Normal breath sounds.  Abdominal:     Palpations: Abdomen is soft.     Tenderness: There is no abdominal tenderness.  Neurological:     General: No focal deficit present.     Mental Status: He is alert and oriented to person, place, and time.         Assessment & Plan:  1. Essential hypertension Slightly elevated readings persist. Will add hydrochlorothiazide 25 mg to the regimen.     Return in about 3 months (around 05/15/2023) for follow up.   [name], MD

## 2023-03-01 ENCOUNTER — Encounter: Payer: Self-pay | Admitting: Family Medicine

## 2023-03-01 ENCOUNTER — Ambulatory Visit: Payer: Self-pay | Admitting: *Deleted

## 2023-03-01 NOTE — Telephone Encounter (Signed)
Patient was called and he was not able to talk at the time . Will call patient back

## 2023-03-01 NOTE — Telephone Encounter (Signed)
Reason for Disposition . Taking water pill (i.e., diuretic) or heart medication (e.g., digoxin)  Protocols used: Heart Rate and Heartbeat Questions-A-AH

## 2023-03-01 NOTE — Telephone Encounter (Signed)
Summary: med reactions   Pt called in states, can't take the hydrochlorothyzide med, anymore, says it gives him heart palpatations and makes him feel dizzy        Attempted to reach pt, unable to leave message on VM. Sounds as though VM comes through and pt states name,but does not connect, unable to leave message.

## 2023-03-01 NOTE — Telephone Encounter (Addendum)
     Chief Complaint: Palpitations after starting hydrochlorothiazide. Lasts a few minutes. "I can't take that medicine." No availability. Symptoms: Above, dizziness Frequency: 3 weeks Pertinent Negatives: Patient denies chest pain Disposition: [] ED /[] Urgent Care (no appt availability in office) / [] Appointment(In office/virtual)/ []  [facility]/ [] Home Care/ [] Refused Recommended Disposition /[] [place] [facility]/ [x]  Follow-up with PCP Additional Notes: Please advise pt.  Answer Assessment - Initial Assessment Questions 1. DESCRIPTION: "Please describe your heart rate or heartbeat that you are having" (e.g., fast/slow, regular/irregular, skipped or extra beats, "palpitations")     Palpitations 2. ONSET: "When did it start?" (Minutes, hours or days)      3 weeks ago 3. DURATION: "How long does it last" (e.g., seconds, minutes, hours)     On and off, minutes 4. PATTERN "Does it come and go, or has it been constant since it started?"  "Does it get worse with exertion?"   "Are you feeling it now?"     No 5. TAP: "Using your hand, can you tap out what you are feeling on a chair or table in front of you, so that I can hear?" (Note: not all patients can do this)       No 6. HEART RATE: "Can you tell me your heart rate?" "How many beats in 15 seconds?"  (Note: not all patients can do this)       Pulse - 7. RECURRENT SYMPTOM: "Have you ever had this before?" If Yes, ask: "When was the last time?" and "What happened that time?"      No 8. CAUSE: "What do you think is causing the palpitations?"     Unsure 9. CARDIAC HISTORY: "Do you have any history of heart disease?" (e.g., heart attack, angina, bypass surgery, angioplasty, arrhythmia)      No 10. OTHER SYMPTOMS: "Do you have any other symptoms?" (e.g., dizziness, chest pain, sweating, difficulty breathing)       Dizzy 11. PREGNANCY: "Is there any chance you are pregnant?" "When was your last menstrual period?"        N/a  Protocols used: Heart Rate and Heartbeat Questions-A-AH

## 2023-03-04 ENCOUNTER — Telehealth: Payer: Self-pay | Admitting: Family Medicine

## 2023-03-04 NOTE — Telephone Encounter (Signed)
Copied from CRM [id number]. Topic: General - Other >> Mar 01, 2023  4:48 PM Albin Felling L wrote: Reason for CRM: Pt received call from office around 4:20. Pt returning call, please call patient back.

## 2023-03-05 NOTE — Telephone Encounter (Signed)
I returned patient called he stated "he was feeling better however his feet was hurting", I sent it to the scheduler to make him an appointment.

## 2023-03-06 ENCOUNTER — Ambulatory Visit (INDEPENDENT_AMBULATORY_CARE_PROVIDER_SITE_OTHER): Payer: Self-pay | Admitting: Family Medicine

## 2023-03-06 ENCOUNTER — Encounter: Payer: Self-pay | Admitting: Family Medicine

## 2023-03-06 VITALS — BP 162/104 | HR 74 | Temp 97.7°F | Resp 16 | Wt 272.0 lb

## 2023-03-06 DIAGNOSIS — R0683 Snoring: Secondary | ICD-10-CM

## 2023-03-06 DIAGNOSIS — M21611 Bunion of right foot: Secondary | ICD-10-CM

## 2023-03-06 DIAGNOSIS — I10 Essential (primary) hypertension: Secondary | ICD-10-CM

## 2023-03-06 MED ORDER — AMLODIPINE BESYLATE 10 MG PO TABS: 10.0000 mg | ORAL_TABLET | Freq: Every day | ORAL | 0 refills | Status: AC

## 2023-03-06 NOTE — Progress Notes (Signed)
Patient is here with c/o having a bunion on his right foot. Patient has tired to keep his feet in good health but the bunion is 8/10 for x 2 days. Patient would like a work note for this.  Patient said that his blood pressure medication is to strong and giving him palpitations. Patient would like to get medication readjusted.  Patient would like a referral for sleep study due to having sort of breath episodes, "per wife"

## 2023-03-08 ENCOUNTER — Encounter: Payer: Self-pay | Admitting: Family Medicine

## 2023-03-08 NOTE — Progress Notes (Signed)
Established Patient Office Visit  Subjective    Patient ID: Richard Bautista, male    DOB: [birth year]  Age: 52 y.o. MRN: [id number]  CC:  Chief Complaint  Patient presents with   Medical Management of Chronic Issues    HPI Richard Bautista presents for complaint of foot pain that he attributes to the bunion. He denies known trauma or injury. He also reports that he has been snoring and that he would like to see if he has sleep apnea.  Outpatient Encounter Medications as of 03/06/2023  Medication Sig   amLODipine (NORVASC) 10 MG tablet Take 1 tablet (10 mg total) by mouth daily.   benzonatate (TESSALON) 100 MG capsule Take 1 capsule (100 mg total) by mouth 3 (three) times daily as needed for cough.   calamine lotion Apply 1 application. topically 3 (three) times daily as needed for itching.   doxycycline (VIBRAMYCIN) 100 MG capsule Take 1 capsule (100 mg total) by mouth 2 (two) times daily.   famotidine (PEPCID) 20 MG tablet Take 1 tablet (20 mg total) by mouth daily.   hydrochlorothiazide (HYDRODIURIL) 25 MG tablet Take 1 tablet (25 mg total) by mouth daily.   losartan (COZAAR) 100 MG tablet Take 1 tablet (100 mg total) by mouth daily.   naproxen (NAPROSYN) 500 MG tablet Take 1 tablet (500 mg total) by mouth 2 (two) times daily.   omeprazole (PRILOSEC) 20 MG capsule Take 1 capsule (20 mg total) by mouth daily.   ondansetron (ZOFRAN-ODT) 4 MG disintegrating tablet Take 1 tablet (4 mg total) by mouth every 8 (eight) hours as needed for nausea or vomiting.   OVER THE COUNTER MEDICATION Take 1 capsule by mouth daily. Seamoss   OVER THE COUNTER MEDICATION Take 1 capsule by mouth daily. Elderberry.   OVER THE COUNTER MEDICATION Take 1 Pump by mouth daily. Isotonix.   predniSONE (STERAPRED UNI-PAK 21 TAB) 10 MG (21) TBPK tablet Take by mouth daily. Take 6 tabs by mouth daily  for 2 days, then 5 tabs for 2 days, then 4 tabs for 2 days, then 3 tabs for 2 days, 2 tabs for 2 days, then 1 tab by mouth  daily for 2 days   No facility-administered encounter medications on file as of 03/06/2023.    Past Medical History:  Diagnosis Date   Blind one eye    right    Detached retina    Knee injury    Pneumonia     Past Surgical History:  Procedure Laterality Date   EYE SURGERY      No family history on file.  Social History   Socioeconomic History   Marital status: Married    Spouse name: Not on file   Number of children: Not on file   Years of education: Not on file   Highest education level: Not on file  Occupational History   Not on file  Tobacco Use   Smoking status: Former    Types: Cigars    Quit date: 02/18/2018    Years since quitting: 5.0   Smokeless tobacco: Never  Vaping Use   Vaping status: Never Used  Substance and Sexual Activity   Alcohol use: Yes    Comment: socially   Drug use: No   Sexual activity: Not on file  Other Topics Concern   Not on file  Social History Narrative   Not on file   Social Drivers of Health   Financial Resource Strain: Low Risk  (01/11/2023)  Overall Financial Resource Strain (CARDIA)    Difficulty of Paying Living Expenses: Not hard at all  Food Insecurity: No Food Insecurity (01/11/2023)   Hunger Vital Sign    Worried About Running Out of Food in the Last Year: Never true    Ran Out of Food in the Last Year: Never true  Transportation Needs: No Transportation Needs (01/11/2023)   PRAPARE - Administrator, Civil Service (Medical): No    Lack of Transportation (Non-Medical): No  Physical Activity: Sufficiently Active (01/11/2023)   Exercise Vital Sign    Days of Exercise per Week: 5 days    Minutes of Exercise per Session: 30 min  Stress: No Stress Concern Present (01/11/2023)   [employer] of Occupational Health - Occupational Stress Questionnaire    Feeling of Stress : Not at all  Social Connections: Socially Integrated (01/11/2023)   Social Connection and Isolation Panel [NHANES]    Frequency  of Communication with Friends and Family: More than three times a week    Frequency of Social Gatherings with Friends and Family: More than three times a week    Attends Religious Services: More than 4 times per year    Active Member of [employer] or Organizations: Yes    Attends Banker Meetings: 1 to 4 times per year    Marital Status: Married  Catering manager Violence: Not At Risk (01/11/2023)   Humiliation, Afraid, Rape, and Kick questionnaire    Fear of Current or Ex-Partner: No    Emotionally Abused: No    Physically Abused: No    Sexually Abused: No    Review of Systems  All other systems reviewed and are negative.       Objective    BP (!) 162/104   Pulse 74   Temp 97.7 F (36.5 C) (Oral)   Resp 16   Wt 272 lb (123.4 kg)   SpO2 98%   BMI 34.00 kg/m   Physical Exam Vitals and nursing note reviewed.  Constitutional:      General: He is not in acute distress. Cardiovascular:     Rate and Rhythm: Normal rate and regular rhythm.  Pulmonary:     Effort: Pulmonary effort is normal.     Breath sounds: Normal breath sounds.  Abdominal:     Palpations: Abdomen is soft.     Tenderness: There is no abdominal tenderness.  Neurological:     General: No focal deficit present.     Mental Status: He is alert and oriented to person, place, and time.         Assessment & Plan:   Bunion of right foot -     Ambulatory referral to Podiatry  Uncontrolled hypertension  Snores -     PSG Sleep Study; Future  Other orders -     amLODIPine Besylate; Take 1 tablet (10 mg total) by mouth daily.  Dispense: 90 tablet; Refill: 0     Return in about 2 weeks (around 03/20/2023) for follow up.   [name], MD

## 2023-03-14 ENCOUNTER — Ambulatory Visit: Payer: Self-pay | Admitting: Family Medicine

## 2023-03-21 ENCOUNTER — Ambulatory Visit: Payer: PRIVATE HEALTH INSURANCE | Admitting: Podiatry

## 2023-03-21 ENCOUNTER — Ambulatory Visit: Payer: Self-pay | Admitting: Family Medicine

## 2023-03-21 ENCOUNTER — Encounter: Payer: Self-pay | Admitting: Family Medicine

## 2023-04-17 ENCOUNTER — Ambulatory Visit: Payer: Self-pay | Admitting: Family Medicine

## 2023-04-18 ENCOUNTER — Encounter: Payer: Self-pay | Admitting: Family Medicine

## 2023-04-18 ENCOUNTER — Ambulatory Visit (INDEPENDENT_AMBULATORY_CARE_PROVIDER_SITE_OTHER): Payer: Self-pay | Admitting: Family Medicine

## 2023-04-18 VITALS — BP 162/101 | HR 87 | Temp 98.1°F | Resp 16

## 2023-04-18 DIAGNOSIS — I10 Essential (primary) hypertension: Secondary | ICD-10-CM

## 2023-04-18 DIAGNOSIS — R052 Subacute cough: Secondary | ICD-10-CM

## 2023-04-18 MED ORDER — SPIRONOLACTONE 50 MG PO TABS: 50.0000 mg | ORAL_TABLET | Freq: Every day | ORAL | 2 refills | Status: AC

## 2023-04-18 NOTE — Progress Notes (Signed)
Patient c/o chronic cough x1week.. Patient is here for 2wk follow-up BP Patent has uncontrolled hypertension Provider is  aware of readings

## 2023-04-19 ENCOUNTER — Encounter: Payer: Self-pay | Admitting: Family Medicine

## 2023-04-19 NOTE — Progress Notes (Signed)
Established Patient Office Visit  Subjective    Patient ID: Richard Bautista, male    DOB: 09-Mar-1971  Age: 53 y.o. MRN: [id number]  CC:  Chief Complaint  Patient presents with   Hypertension    HPI Richard Bautista presents for follow up of hypertension. Patient reports taking meds as recommended and denies acute complaints.   Outpatient Encounter Medications as of 04/18/2023  Medication Sig   amLODipine (NORVASC) 10 MG tablet Take 1 tablet (10 mg total) by mouth daily.   calamine lotion Apply 1 application. topically 3 (three) times daily as needed for itching.   doxycycline (VIBRAMYCIN) 100 MG capsule Take 1 capsule (100 mg total) by mouth 2 (two) times daily.   famotidine (PEPCID) 20 MG tablet Take 1 tablet (20 mg total) by mouth daily.   hydrochlorothiazide (HYDRODIURIL) 25 MG tablet Take 1 tablet (25 mg total) by mouth daily.   losartan (COZAAR) 100 MG tablet Take 1 tablet (100 mg total) by mouth daily.   naproxen (NAPROSYN) 500 MG tablet Take 1 tablet (500 mg total) by mouth 2 (two) times daily.   omeprazole (PRILOSEC) 20 MG capsule Take 1 capsule (20 mg total) by mouth daily.   ondansetron (ZOFRAN-ODT) 4 MG disintegrating tablet Take 1 tablet (4 mg total) by mouth every 8 (eight) hours as needed for nausea or vomiting.   OVER THE COUNTER MEDICATION Take 1 capsule by mouth daily. Seamoss   OVER THE COUNTER MEDICATION Take 1 capsule by mouth daily. Elderberry.   OVER THE COUNTER MEDICATION Take 1 Pump by mouth daily. Isotonix.   predniSONE (STERAPRED UNI-PAK 21 TAB) 10 MG (21) TBPK tablet Take by mouth daily. Take 6 tabs by mouth daily  for 2 days, then 5 tabs for 2 days, then 4 tabs for 2 days, then 3 tabs for 2 days, 2 tabs for 2 days, then 1 tab by mouth daily for 2 days   spironolactone (ALDACTONE) 50 MG tablet Take 1 tablet (50 mg total) by mouth daily.   No facility-administered encounter medications on file as of 04/18/2023.    Past Medical History:  Diagnosis Date    Blind one eye    right    Detached retina    Knee injury    Pneumonia     Past Surgical History:  Procedure Laterality Date   EYE SURGERY      No family history on file.  Social History   Socioeconomic History   Marital status: Married    Spouse name: Not on file   Number of children: Not on file   Years of education: Not on file   Highest education level: Not on file  Occupational History   Not on file  Tobacco Use   Smoking status: Former    Types: Cigars    Quit date: 02/18/2018    Years since quitting: 5.1   Smokeless tobacco: Never  Vaping Use   Vaping status: Never Used  Substance and Sexual Activity   Alcohol use: Yes    Comment: socially   Drug use: No   Sexual activity: Not on file  Other Topics Concern   Not on file  Social History Narrative   Not on file   Social Drivers of Health   Financial Resource Strain: Low Risk  (01/11/2023)   Overall Financial Resource Strain (CARDIA)    Difficulty of Paying Living Expenses: Not hard at all  Food Insecurity: No Food Insecurity (01/11/2023)   Hunger Vital Sign  Worried About Programme researcher, broadcasting/film/video in the Last Year: Never true    Ran Out of Food in the Last Year: Never true  Transportation Needs: No Transportation Needs (01/11/2023)   PRAPARE - Administrator, Civil Service (Medical): No    Lack of Transportation (Non-Medical): No  Physical Activity: Sufficiently Active (01/11/2023)   Exercise Vital Sign    Days of Exercise per Week: 5 days    Minutes of Exercise per Session: 30 min  Stress: No Stress Concern Present (01/11/2023)   [employer] of Occupational Health - Occupational Stress Questionnaire    Feeling of Stress : Not at all  Social Connections: Socially Integrated (01/11/2023)   Social Connection and Isolation Panel [NHANES]    Frequency of Communication with Friends and Family: More than three times a week    Frequency of Social Gatherings with Friends and Family: More than  three times a week    Attends Religious Services: More than 4 times per year    Active Member of [employer] or Organizations: Yes    Attends Banker Meetings: 1 to 4 times per year    Marital Status: Married  Catering manager Violence: Not At Risk (01/11/2023)   Humiliation, Afraid, Rape, and Kick questionnaire    Fear of Current or Ex-Partner: No    Emotionally Abused: No    Physically Abused: No    Sexually Abused: No    Review of Systems  All other systems reviewed and are negative.       Objective    BP (!) 162/101   Pulse 87   Temp 98.1 F (36.7 C) (Oral)   Resp 16   SpO2 (!) 88%   Physical Exam Vitals and nursing note reviewed.  Constitutional:      General: He is not in acute distress. Cardiovascular:     Rate and Rhythm: Normal rate and regular rhythm.  Pulmonary:     Effort: Pulmonary effort is normal.     Breath sounds: Normal breath sounds.  Abdominal:     Palpations: Abdomen is soft.     Tenderness: There is no abdominal tenderness.  Neurological:     General: No focal deficit present.     Mental Status: He is alert and oriented to person, place, and time.         Assessment & Plan:   Uncontrolled hypertension -     Ambulatory referral to Cardiology  Subacute cough  Other orders -     Spironolactone; Take 1 tablet (50 mg total) by mouth daily.  Dispense: 30 tablet; Refill: 2     No follow-ups on file.   [name], MD

## 2023-04-26 ENCOUNTER — Encounter: Payer: Self-pay | Admitting: Family Medicine

## 2023-05-02 ENCOUNTER — Ambulatory Visit: Payer: Self-pay | Admitting: Family Medicine

## 2023-05-16 ENCOUNTER — Ambulatory Visit: Payer: Self-pay | Admitting: Family Medicine

## 2023-05-21 ENCOUNTER — Ambulatory Visit: Payer: Self-pay | Admitting: Family Medicine

## 2023-06-02 ENCOUNTER — Ambulatory Visit (HOSPITAL_BASED_OUTPATIENT_CLINIC_OR_DEPARTMENT_OTHER): Payer: Self-pay | Attending: Family Medicine | Admitting: Internal Medicine

## 2023-06-02 VITALS — Ht 75.0 in | Wt 260.0 lb

## 2023-06-02 DIAGNOSIS — R0683 Snoring: Secondary | ICD-10-CM

## 2023-06-02 DIAGNOSIS — G4733 Obstructive sleep apnea (adult) (pediatric): Secondary | ICD-10-CM

## 2023-06-08 DIAGNOSIS — R0683 Snoring: Secondary | ICD-10-CM

## 2023-06-08 NOTE — Procedures (Signed)
 [facility] [address] [place], [place] [address] Tel: [phone]   Fax: [phone]  Polysomnography Interpretation  Patient Name:  Richard Bautista, Richard Bautista Date:  06/02/2023 Referring Physician:  Georganna Skeans Md  Indications for Polysomnography The patient is a 53 year old Male who is 6\' 3"  and weighs 160.0 lbs. His BMI equals 20.1.  A full night polysomnogram was performed to evaluate for -.  Medication  No bedtime medication reported   Polysomnogram Data A full night polysomnogram recorded the standard physiologic parameters including EEG, EOG, EMG, EKG, nasal and oral airflow.  Respiratory parameters of chest and abdominal movements were recorded with Respiratory Inductance Plethysmography belts.  Oxygen saturation was recorded by pulse oximetry.   Sleep Architecture The total recording time of the polysomnogram was 367.5 minutes.  The total sleep time was 338.5 minutes.  The patient spent 4.9% of total sleep time in Stage N1, 68.5% in Stage N2, 13.0% in Stages N3, and 13.6% in REM.  Sleep latency was 0.3 minutes.  REM latency was 48.5 minutes.  Sleep Efficiency was 92.1%.  Wake after Sleep Onset time was 28.5 minutes.  Respiratory Events The polysomnogram revealed a presence of 7 obstructive, - central, and - mixed apneas resulting in an Apnea index of 1.2 events per hour.  There were 103 hypopneas (>=3% desaturation and/or arousal) resulting in an Apnea\Hypopnea Index (AHI >=3% desaturation and/or arousal) of 19.5 events per hour.  There were 51 hypopneas (>=4% desaturation) resulting in an Apnea\Hypopnea Index (AHI >=4% desaturation) of 10.3 events per hour.  There were - Respiratory Effort Related Arousals resulting in a RERA index of - events per hour. The Respiratory Disturbance Index is 19.5 events per hour.  The snore index was 553.9 events per hour.  Mean oxygen saturation was 94.2%.  The lowest oxygen saturation during sleep was  89.0%.  Time spent <=88% oxygen saturation was - minutes (-).  End Tidal CO2 during sleep ranged from - to - mmHg. End Tidal CO2 was greater than 50 mmHg for - minutes and greater than 55 mmHg for - minutes.  Limb Activity There were 0 total limb movements recorded, of this total, - were classified as PLMs.  PLM index was - per hour and PLM associated with Arousals index was - per hour.  Cardiac Summary The average pulse rate was 61.7 bpm.  The minimum pulse rate was 55.0 bpm while the maximum pulse rate was 96.0 bpm.  Cardiac rhythm was normal.  Comment:  Mild obstructive sleep apnea, AHI (4%) 10.3/hr. Snoring with oxygen desaturation to a nadir of 89% and mean 94%.  Diagnosis: Obstructive Sleep Apnea  Recommendations: Treatment for mild OSA is guided by symptoms and co-morbidity. Conservative measures may include observation, weight loss and sleep position off back. Other options, including CPAP, a fitted oral appliance or ENT evaluation, would be based on clinical judgment.   This study was personally reviewed and electronically signed by: Jetty Duhamel, Md Accredited Board Certified in Sleep Medicine Date/Time: 06/08/23   12:00        Diagnostic PSG Report  Patient Name: Richard Bautista, Richard Bautista Study Date: 06/02/2023  Date of Birth: 1970/04/10 Study Type: Diagnostic  Age: 52 year MRN #: [id number]  Sex: Male Interpreting Physician: Jetty Duhamel [id number]  Height: 6\' 3"  Referring Physician: Georganna Skeans Md  Weight: 160.0 lbs Recording Tech: Armen Pickup RPSGT RST  BMI: 20.1 Scoring Tech: Armen Pickup RPSGT RST  ESS: 11 Neck Size: 19   Study Overview  Lights Off: 10:41:52 PM  Count Index  Lights On: 04:49:24 AM Awakenings: 19 3.4  Time in Bed: 367.5 min. Arousals: 71 12.6  Total Sleep Time: 338.5 min. AHI (>=3% Desat and/or Ar.): 110 19.5   Sleep Efficiency: 92.1% AHI (>=4% Desat): 58 10.3   Sleep Latency: 0.3 min. Limb Movements: - -  Wake After Sleep Onset: 28.5 min. Snore:  3125 553.9  REM Latency from Sleep Onset: 48.5 min. Desaturations: 121 21.4     Minimum SpO2 TST: 89.0%    Sleep Architecture  % of Time in Bed Stages Time (mins) % Sleep Time  Wake 29.5   Stage N1 16.5 4.9%  Stage N2 232.0 68.5%  Stage N3 44.0 13.0%  REM 46.0 13.6%   Arousal Summary   NREM REM Sleep Index  Respiratory Arousals 27 1 28  5.0  PLM Arousals - - - -  Isolated Limb Movement Arousals - - - -  Snore Arousals 11 2 13  2.3  Spontaneous Arousals 29 1 30  5.3  Total 67 4 71 12.6   Limb Movement Summary   Count Index  Isolated Limb Movements - -  Periodic Limb Movements (PLMs) - -  Total Limb Movements - -  Respiratory Summary   By Sleep Stage By Body Position Total   NREM REM Supine Non-Supine   Time (min) 292.5 46.0 7.0 331.5 338.5         Obstructive Apnea 7 - - 7 7  Mixed Apnea - - - - -  Central Apnea - - - - -  Total Apneas 7 - - 7 7  Total Apnea Index 1.4 - - 1.3 1.2         Hypopneas (>=3% Desat and/or Ar.) 88 15 - 103 103  AHI (>=3% Desat and/or Ar.) 19.5 19.6 - 19.9 19.5         Hypopneas (>=4% Desat) 44 7 - 51 51  AHI (>=4% Desat) 10.5 9.1 - 10.5 10.3          RERAs - - - - -  RERA Index - - - - -         RDI 19.5 19.6 - 19.9 19.5    Respiratory Event Type Index  Central Apneas -  Obstructive Apneas 1.2  Mixed Apneas -  Central Hypopneas -  Obstructive Hypopneas -  Central Apnea + Hypopnea (CAHI) -  Obstructive Apnea + Hypopnea (OAHI) 23.4   Respiratory Event Durations   Apnea Hypopnea   NREM REM NREM REM  Average (seconds) 19.8 - 35.0 39.0  Maximum (seconds) 24.0 - 79.4 62.4    Oxygen Saturation Summary   Wake NREM REM TST TIB  Average SpO2 (%) 95.6% 94.1% 94.1% 94.1% 94.2%  Minimum SpO2 (%) 92.0% 90.0% 89.0% 89.0% 89.0%  Maximum SpO2 (%) 99.0% 98.0% 98.0% 98.0% 99.0%   Oxygen Saturation Distribution  Range (%) Time in range (min) Time in range (%)  90.0 - 100.0 362.1 99.7%  80.0 - 90.0 0.5 0.1%  70.0 - 80.0 - -  60.0 -  70.0 - -  50.0 - 60.0 - -  0.0 - 50.0 - -  Time Spent <=88% SpO2  Range (%) Time in range (min) Time in range (%)  0.0 - 88.0 - -      Count Index  Desaturations 121 21.4    Cardiac Summary   Wake NREM REM Sleep Total  Average Pulse Rate (BPM) 66.4 61.0 63.7 61.3 61.7  Minimum Pulse Rate (BPM) 58.0 55.0 55.0  55.0 55.0  Maximum Pulse Rate (BPM) 96.0 94.0 78.0 94.0 96.0   Pulse Rate Distribution:  Range (bpm) Time in range (min) Time in range (%)  0.0 - 40.0 - -  40.0 - 60.0 157.2 43.1%  60.0 - 80.0 205.7 56.4%  80.0 - 100.0 1.6 0.4%  100.0 - 120.0 - -  120.0 - 140.0 - -  140.0 - 200.0 - -   EtCO2 Summary  Stage Min (mmHg) Average (mmHg) Max (mmHg)  Wake - - -  NREM(1+2+3) - - -  REM - - -   EtCO2 Distribution:  Range (mmHg) Time in range (min) Time in range (%)  20.0 - 40.0 - -  40.0 - 50.0 - -  50.0 - 100.0 - -  55.0 - 100.0 - -  Excluded data <20.0 & >65.0 368.0 100.0%     Hypnograms                         Comments  Pt was here at sleep lab for snoring.  A PSG study was ordered.  Respiratory events were noted.  Snoring was mild to severe.  Plm's was rare.  EKG showed NSR.  No mediation was taken.  No oxygen was applied.  Jetty Duhamel Diplomate, Biomedical engineer of Sleep Medicine  ELECTRONICALLY SIGNED ON:  06/08/2023, 12:12 PM [facility] PH: [phone]   FX: [phone] ACCREDITED BY THE AMERICAN ACADEMY OF SLEEP MEDICINE

## 2023-06-19 ENCOUNTER — Ambulatory Visit: Payer: Self-pay | Attending: Cardiovascular Disease | Admitting: Cardiovascular Disease

## 2023-06-20 ENCOUNTER — Encounter: Payer: Self-pay | Admitting: Cardiovascular Disease

## 2023-11-10 ENCOUNTER — Emergency Department (HOSPITAL_COMMUNITY)
Admission: EM | Admit: 2023-11-10 | Discharge: 2023-11-10 | Disposition: A | Discharge status: Home / Self Care | Payer: Self-pay | Attending: Emergency Medicine | Admitting: Emergency Medicine

## 2023-11-10 ENCOUNTER — Encounter (HOSPITAL_COMMUNITY): Payer: Self-pay

## 2023-11-10 ENCOUNTER — Other Ambulatory Visit: Payer: Self-pay

## 2023-11-10 ENCOUNTER — Emergency Department (HOSPITAL_COMMUNITY): Payer: Self-pay

## 2023-11-10 DIAGNOSIS — I10 Essential (primary) hypertension: Secondary | ICD-10-CM | POA: Insufficient documentation

## 2023-11-10 DIAGNOSIS — J069 Acute upper respiratory infection, unspecified: Secondary | ICD-10-CM | POA: Insufficient documentation

## 2023-11-10 DIAGNOSIS — Z79899 Other long term (current) drug therapy: Secondary | ICD-10-CM | POA: Insufficient documentation

## 2023-11-10 LAB — RESP PANEL BY RT-PCR (RSV, FLU A&B, COVID)  RVPGX2
Influenza A by PCR: NEGATIVE
Influenza B by PCR: NEGATIVE
Resp Syncytial Virus by PCR: NEGATIVE
SARS Coronavirus 2 by RT PCR: NEGATIVE

## 2023-11-10 MED ORDER — FLUTICASONE PROPIONATE 50 MCG/ACT NA SUSP: 2.0000 | Freq: Every day | NASAL | 2 refills | Status: AC

## 2023-11-10 MED ORDER — BENZONATATE 100 MG PO CAPS: 200.0000 mg | ORAL_CAPSULE | Freq: Three times a day (TID) | ORAL | 0 refills | Status: AC

## 2023-11-10 NOTE — ED Triage Notes (Signed)
 Reports coughing fever and congestion that started on Friday after sick exposure.  Denies productive cough.  +HA

## 2023-11-10 NOTE — Discharge Instructions (Signed)
 Today you were seen for what is likely a viral upper respiratory infection.  You may alternate taking Tylenol  and Motrin  as needed for fever and pain, Flonase  as needed for nasal congestion, and plain Mucinex as needed for chest congestion.  You have also been prescribed Tessalon  Perles for cough.  Thank you for letting us  treat you today. After reviewing your labs and imaging, I feel you are safe to go home. Please follow up with your PCP in the next several days and provide them with your records from this visit. Return to the Emergency Room if pain becomes severe or symptoms worsen.

## 2023-11-10 NOTE — ED Provider Notes (Signed)
 [place] EMERGENCY DEPARTMENT AT [facility] Provider Note   CSN: [id number] Arrival date & time: 11/10/23  9293     Patient presents with: URI   Richard Bautista is a 53 y.o. male past medical history significant for hypertension presents today for headache, cough, fever, and congestion that began on Friday.  Patient reports sick exposure.  Patient denies productive cough, nausea, vomiting, tinnitus, vision changes, chest pain, shortness of breath, or abdominal pain.    URI Presenting symptoms: congestion, cough and fever   Associated symptoms: headaches        Prior to Admission medications   Medication Sig Start Date End Date Taking? Authorizing Provider  benzonatate  (TESSALON ) 100 MG capsule Take 2 capsules (200 mg total) by mouth 3 (three) times daily. 11/10/23  Yes Chania Kochanski N, PA-C  fluticasone  (FLONASE ) 50 MCG/ACT nasal spray Place 2 sprays into both nostrils daily. 11/10/23  Yes Dameon Soltis N, PA-C  amLODipine  (NORVASC ) 10 MG tablet Take 1 tablet (10 mg total) by mouth daily. 03/06/23   Tanda Bleacher, MD  calamine lotion Apply 1 application. topically 3 (three) times daily as needed for itching.    [provider]  doxycycline  (VIBRAMYCIN ) 100 MG capsule Take 1 capsule (100 mg total) by mouth 2 (two) times daily. 06/02/22   Logan Ubaldo NOVAK, PA-C  famotidine  (PEPCID ) 20 MG tablet Take 1 tablet (20 mg total) by mouth daily. 08/26/21   Hildegard Loge, PA-C  hydrochlorothiazide  (HYDRODIURIL ) 25 MG tablet Take 1 tablet (25 mg total) by mouth daily. 02/12/23   Tanda Bleacher, MD  losartan  (COZAAR ) 100 MG tablet Take 1 tablet (100 mg total) by mouth daily. 01/11/23   Tanda Bleacher, MD  naproxen  (NAPROSYN ) 500 MG tablet Take 1 tablet (500 mg total) by mouth 2 (two) times daily. 06/02/22   Logan Ubaldo NOVAK, PA-C  omeprazole  (PRILOSEC) 20 MG capsule Take 1 capsule (20 mg total) by mouth daily. 05/14/22   Vicky Charleston, PA-C  ondansetron  (ZOFRAN -ODT) 4 MG  disintegrating tablet Take 1 tablet (4 mg total) by mouth every 8 (eight) hours as needed for nausea or vomiting. 05/14/22   Vicky Charleston, PA-C  OVER THE COUNTER MEDICATION Take 1 capsule by mouth daily. Seamoss    [provider]  OVER THE COUNTER MEDICATION Take 1 capsule by mouth daily. Elderberry.    [provider]  OVER THE COUNTER MEDICATION Take 1 Pump by mouth daily. Isotonix.    [provider]  predniSONE  (STERAPRED UNI-PAK 21 TAB) 10 MG (21) TBPK tablet Take by mouth daily. Take 6 tabs by mouth daily  for 2 days, then 5 tabs for 2 days, then 4 tabs for 2 days, then 3 tabs for 2 days, 2 tabs for 2 days, then 1 tab by mouth daily for 2 days 08/26/21   Hildegard Loge, PA-C  spironolactone  (ALDACTONE ) 50 MG tablet Take 1 tablet (50 mg total) by mouth daily. 04/18/23   Tanda Bleacher, MD    Allergies: Benadryl  [diphenhydramine ], Erythromycin, Other, and Poison ivy extract    Review of Systems  Constitutional:  Positive for fever.  HENT:  Positive for congestion.   Respiratory:  Positive for cough.   Neurological:  Positive for headaches.    Updated Vital Signs BP (!) 164/98 (BP Location: Right Arm)   Pulse 84   Temp 97.9 F (36.6 C) (Oral)   Resp 16   Ht 6' 3 (1.905 m)   Wt 117.9 kg   SpO2 95%  BMI 32.50 kg/m   Physical Exam Vitals and nursing note reviewed.  Constitutional:      General: He is not in acute distress.    Appearance: He is well-developed. He is not toxic-appearing or diaphoretic.  HENT:     Head: Normocephalic and atraumatic.     Right Ear: External ear normal.     Left Ear: External ear normal.     Nose: Congestion and rhinorrhea present.     Mouth/Throat:     Mouth: Mucous membranes are moist.     Pharynx: Oropharynx is clear.  Eyes:     Extraocular Movements: Extraocular movements intact.     Conjunctiva/sclera: Conjunctivae normal.  Cardiovascular:     Rate and Rhythm: Normal rate and regular rhythm.     Pulses: Normal  pulses.     Heart sounds: Normal heart sounds. No murmur heard. Pulmonary:     Effort: Pulmonary effort is normal. No respiratory distress.     Breath sounds: Normal breath sounds. No wheezing.  Abdominal:     Palpations: Abdomen is soft.     Tenderness: There is no abdominal tenderness.  Musculoskeletal:        General: No swelling.     Cervical back: Neck supple.     Right lower leg: No edema.     Left lower leg: No edema.  Skin:    General: Skin is warm and dry.     Capillary Refill: Capillary refill takes less than 2 seconds.  Neurological:     General: No focal deficit present.     Mental Status: He is alert and oriented to person, place, and time.  Psychiatric:        Mood and Affect: Mood normal.     (all labs ordered are listed, but only abnormal results are displayed) Labs Reviewed  RESP PANEL BY RT-PCR (RSV, FLU A&B, COVID)  [id number]    EKG: None  Radiology: DG Chest 2 View Result Date: 11/10/2023 EXAM: 2 VIEW(S) XRAY OF THE CHEST 11/10/2023 07:38:00 AM COMPARISON: 05/14/2022 CLINICAL HISTORY: Cough. Reports coughing fever and congestion that started on Friday after sick exposure. Denies productive cough. FINDINGS: LUNGS AND PLEURA: No focal pulmonary opacity. No pulmonary edema. No pleural effusion. No pneumothorax. Low lung volumes with accentuation of the vascular markings. HEART AND MEDIASTINUM: No acute abnormality of the cardiac and mediastinal silhouettes. Small hiatal hernia. BONES AND SOFT TISSUES: No acute osseous abnormality. IMPRESSION: 1. Low lung volumes with accentuation of the vascular markings. 2. Hiatal hernia. Electronically signed by: Waddell Calk MD 11/10/2023 08:03 AM EDT RP Workstation: [id number]     Procedures   Medications Ordered in the ED - No data to display                                  Medical Decision Making Amount and/or Complexity of Data Reviewed Radiology: ordered.   This patient presents to the ED for concern of URI  symptoms differential diagnosis includes COVID, flu, RSV, viral URI, pneumonia  Additional history obtained Additional history obtained from Electronic Medical Record External records from outside source obtained and reviewed including family medicine note   Lab Tests:  I Ordered, and personally interpreted labs.  The pertinent results include: Respiratory panel negative   Imaging Studies ordered:  I ordered imaging studies including chest x-ray I independently visualized and interpreted imaging which showed low lung volumes with accentuation of vascular markings.  Hiatal hernia. I agree with the radiologist interpretation   Problem List / ED Course:  Considered for admission or further workup however patients vital signs, physical exam, labs, and imaging are reassuring. Patient's symptoms likely due to a viral upper respiratory infection. Patient advised to take Tylenol /Motrin  as needed for fever, Flonase  as needed for nasal congestion, and plain Mucinex as needed for chest congestion.  Patient prescribed Tessalon  Perles and Flonase .  Patient should follow-up with their primary care in the upcoming week if their symptoms persist for further evaluation and workup.         Final diagnoses:  Viral URI with cough    ED Discharge Orders          Ordered    benzonatate  (TESSALON ) 100 MG capsule  3 times daily        11/10/23 0843    fluticasone  (FLONASE ) 50 MCG/ACT nasal spray  Daily        11/10/23 0843               Francis Ileana SAILOR, PA-C 11/10/23 9155    Randol Simmonds, MD 11/11/23 1501
